# Patient Record
Sex: Female | Born: 1970 | Race: Black or African American | Hispanic: No | Marital: Married | Smoking: Never smoker
Health system: Southern US, Community
[De-identification: ages and names within clinical notes are randomized; demographics above are authoritative.]

## PROBLEM LIST (undated history)

## (undated) DIAGNOSIS — K219 Gastro-esophageal reflux disease without esophagitis: Secondary | ICD-10-CM

## (undated) DIAGNOSIS — I10 Essential (primary) hypertension: Secondary | ICD-10-CM

## (undated) DIAGNOSIS — I4891 Unspecified atrial fibrillation: Secondary | ICD-10-CM

## (undated) HISTORY — PX: DILATION AND CURETTAGE OF UTERUS: SHX78

## (undated) HISTORY — PX: KNEE SURGERY: SHX244

---

## 2002-11-11 ENCOUNTER — Encounter (INDEPENDENT_AMBULATORY_CARE_PROVIDER_SITE_OTHER): Payer: Self-pay

## 2002-11-11 ENCOUNTER — Ambulatory Visit (HOSPITAL_COMMUNITY): Admission: RE | Admit: 2002-11-11 | Discharge: 2002-11-11 | Payer: Self-pay | Admitting: Obstetrics & Gynecology

## 2004-06-30 ENCOUNTER — Inpatient Hospital Stay (HOSPITAL_COMMUNITY): Admission: AD | Admit: 2004-06-30 | Discharge: 2004-06-30 | Payer: Self-pay | Admitting: Obstetrics and Gynecology

## 2004-07-07 ENCOUNTER — Inpatient Hospital Stay (HOSPITAL_COMMUNITY): Admission: AD | Admit: 2004-07-07 | Discharge: 2004-07-10 | Payer: Self-pay | Admitting: Obstetrics and Gynecology

## 2004-07-18 ENCOUNTER — Ambulatory Visit (HOSPITAL_COMMUNITY): Admission: RE | Admit: 2004-07-18 | Discharge: 2004-07-18 | Payer: Self-pay | Admitting: Obstetrics & Gynecology

## 2004-07-29 ENCOUNTER — Ambulatory Visit: Payer: Self-pay | Admitting: *Deleted

## 2004-08-05 ENCOUNTER — Ambulatory Visit: Payer: Self-pay | Admitting: *Deleted

## 2004-08-06 ENCOUNTER — Observation Stay (HOSPITAL_COMMUNITY): Admission: AD | Admit: 2004-08-06 | Discharge: 2004-08-07 | Payer: Self-pay | Admitting: Obstetrics & Gynecology

## 2004-08-08 ENCOUNTER — Inpatient Hospital Stay (HOSPITAL_COMMUNITY): Admission: AD | Admit: 2004-08-08 | Discharge: 2004-08-10 | Payer: Self-pay | Admitting: Obstetrics and Gynecology

## 2004-09-17 ENCOUNTER — Other Ambulatory Visit: Admission: RE | Admit: 2004-09-17 | Discharge: 2004-09-17 | Payer: Self-pay | Admitting: Obstetrics & Gynecology

## 2008-04-19 ENCOUNTER — Emergency Department (HOSPITAL_BASED_OUTPATIENT_CLINIC_OR_DEPARTMENT_OTHER): Admission: EM | Admit: 2008-04-19 | Discharge: 2008-04-19 | Payer: Self-pay | Admitting: Emergency Medicine

## 2008-04-19 ENCOUNTER — Ambulatory Visit: Payer: Self-pay | Admitting: Diagnostic Radiology

## 2008-08-06 ENCOUNTER — Ambulatory Visit: Payer: Self-pay | Admitting: Diagnostic Radiology

## 2008-08-06 ENCOUNTER — Emergency Department (HOSPITAL_BASED_OUTPATIENT_CLINIC_OR_DEPARTMENT_OTHER): Admission: EM | Admit: 2008-08-06 | Discharge: 2008-08-06 | Payer: Self-pay | Admitting: Emergency Medicine

## 2010-06-02 LAB — BASIC METABOLIC PANEL
BUN: 13 mg/dL (ref 6–23)
CO2: 32 mEq/L (ref 19–32)
Calcium: 9.4 mg/dL (ref 8.4–10.5)
GFR calc Af Amer: >60 mL/min (ref >60)
GFR calc non Af Amer: >60 mL/min (ref >60)

## 2010-06-02 LAB — POCT CARDIAC MARKERS: Myoglobin, poc: 50 ng/mL (ref 12–200)

## 2010-06-02 LAB — DIFFERENTIAL
Basophils Absolute: 0.1 10*3/uL (ref 0.0–0.1)
Monocytes Absolute: 0.7 10*3/uL (ref 0.1–1.0)
Neutro Abs: 6.2 10*3/uL (ref 1.7–7.7)
Neutrophils Relative %: 62 % (ref 43–77)

## 2010-06-02 LAB — CBC
HCT: 38.6 % (ref 36.0–46.0)
MCV: 87.5 fL (ref 78.0–100.0)
Platelets: 258 10*3/uL (ref 150–400)
RDW: 12.1 % (ref 11.5–15.5)
WBC: 9.9 10*3/uL (ref 4.0–10.5)

## 2010-06-02 LAB — D-DIMER, QUANTITATIVE: D-Dimer, Quant: <0.22 ug/mL-FEU (ref 0.00–0.48)

## 2010-06-10 LAB — URINE MICROSCOPIC-ADD ON

## 2010-06-10 LAB — URINALYSIS, ROUTINE W REFLEX MICROSCOPIC
Glucose, UA: NEGATIVE mg/dL
Hgb urine dipstick: NEGATIVE
Specific Gravity, Urine: 1.021 (ref 1.005–1.030)
pH: 6.5 (ref 5.0–8.0)

## 2010-06-10 LAB — COMPREHENSIVE METABOLIC PANEL
ALT: 15 U/L (ref 0–35)
Albumin: 3.9 g/dL (ref 3.5–5.2)
Alkaline Phosphatase: 74 U/L (ref 39–117)
BUN: 8 mg/dL (ref 6–23)
GFR calc Af Amer: >60 mL/min (ref >60)
Potassium: 3.8 mEq/L (ref 3.5–5.1)
Sodium: 142 mEq/L (ref 135–145)
Total Protein: 7.7 g/dL (ref 6.0–8.3)

## 2010-06-10 LAB — DIFFERENTIAL
Basophils Relative: 1 % (ref 0–1)
Monocytes Absolute: 0.7 10*3/uL (ref 0.1–1.0)
Monocytes Relative: 7 % (ref 3–12)
Neutro Abs: 6.5 10*3/uL (ref 1.7–7.7)

## 2010-06-10 LAB — CBC
Platelets: 252 10*3/uL (ref 150–400)
RDW: 12.6 % (ref 11.5–15.5)

## 2010-07-11 NOTE — Discharge Summary (Signed)
NAME:  JAKEIA, CARRERAS                 ACCOUNT NO.:  [id number]   MEDICAL RECORD NO.:  [id number]          PATIENT TYPE:  INP   LOCATION:  9149                          FACILITY:  [facility]   PHYSICIAN:  Freddy Finner, M.D.   DATE OF BIRTH:  Aug 19, 1970   DATE OF ADMISSION:  07/07/2004  DATE OF DISCHARGE:  07/10/2004                                 DISCHARGE SUMMARY   ADMITTING DIAGNOSES:  1.  Intrauterine pregnancy at 32 and five-sevenths weeks.  2.  Nonreassuring fetal heart tones.  3.  Chronic hypertension.   DISCHARGE DIAGNOSES:  1.  Intrauterine pregnancy at 83 weeks estimated gestational age.  2.  Chronic hypertension.   REASON FOR ADMISSION:  Please see written H&P.   HOSPITAL COURSE:  The patient was a 40 year old African-American black  female gravida 4 para 2 that was admitted to [facility] at  32 and five-sevenths weeks estimated gestational age for observation. The  patient had been seen in the office and was noted to have some nonreassuring  fetal heart tones on the monitor. Pregnancy had been complicated by chronic  hypertension, currently on labetalol 100 mg b.i.d. The patient was sent to  triage area for additional monitoring and biophysical profile. Biophysical  profile was performed which revealed score 6/8 with AFI of 8.4 which was in  the 5th percentile. The patient had had a previous biophysical profile  approximately 1 week prior to admission with AFI of 11.1. The patient denied  leakage of fluid, CNS symptoms, or epigastric pain. Blood pressures on  admission 130s to 150s over 80s to 90s. Cervix was not examined; however,  was noted to be 4.1 cm in length per ultrasound. Deep tendon reflexes 2+  without clonus. Fetal heart tones were noted to have some accelerations with  several variable decelerations. PIH labs were within normal limits. The  patient was admitted, was administered IV fluids, betamethasone, and  continuous external fetal monitoring.  The patient was scheduled to have 24-  hour urine collection, PIH labs and biophysical profile. On the following  morning the patient was without complaint. Vital signs were stable with  blood pressure 130/80. Liver function tests remained within normal limits.  Hemoglobin is 11.1; platelet count 286,000. Urine collection was negative  for protein. On hospital day #2 the patient was without complaint. Fetal  movement was noted to be good. Vital signs were stable. Fetal monitoring did  note that fetal heart tones were reactive. No contractions were noted.  Creatinine clearance was 161. The patient was scheduled for biophysical  profile which performed later in the day which revealed 6.8. On the  following morning, vital signs were stable. Blood pressure 120/80. Repeat  ultrasound had revealed AFI was now up to 11.1. Biophysical profile was 8/8.  MCA was normal, fetal heart tones were reactive. Decision was made to  discharge the patient home on bedrest and continue labetalol.   CONDITION ON DISCHARGE:  Stable.   DIET:  Regular as tolerated.   ACTIVITY:  Moderate bedrest.   FOLLOW UP:  The patient to follow up in  the office in 5 days. She is to call  for decreased fetal movement, spontaneous rupture of membranes or increase  in labor contractions, headache, blurred vision, or right upper quadrant  pain.   DISCHARGE MEDICATIONS:  1.  Labetalol 100 mg one p.o. b.i.d.  2.  Prenatal vitamins one p.o. daily.       CC/MEDQ  D:  08/01/2004  T:  08/01/2004  Job:  [id number]

## 2010-07-11 NOTE — Op Note (Signed)
NAME:  Judith Aguirre, Judith Aguirre                           ACCOUNT NO.:  [id number]   MEDICAL RECORD NO.:  [id number]                   PATIENT TYPE:  AMB   LOCATION:  SDC                                  FACILITY:  [facility]   PHYSICIAN:  Freddy Finner, M.D.                DATE OF BIRTH:  June 20, 1970   DATE OF PROCEDURE:  11/11/2002  DATE OF DISCHARGE:                                 OPERATIVE REPORT   PREOPERATIVE DIAGNOSIS:  Intrauterine fetal demise at 19-4/7 weeks'  gestation by dates and early ultrasound.   POSTOPERATIVE DIAGNOSIS:  Intrauterine fetal demise at 19-4/7 weeks'  gestation by dates and early ultrasound.   OPERATIVE PROCEDURE:  Dilatation and evacuation.   SURGEON:  Freddy Finner, M.D.   ANESTHESIA:  General.   ESTIMATED INTRAOPERATIVE BLOOD LOSS:  400 mL.   INTRAOPERATIVE COMPLICATIONS:  None.   The patient's blood type is known to be B positive.   The patient is a 40 year old black married female, gravida 3, para 2, who  was seen for her initial obstetrical exam on August 20, at which time she  was 15-3/7 weeks' gestation and ultrasound confirmed fetal heart tones and a  viable pregnancy.  She presented for routine examination on the day prior to  this admission, at which time ultrasound was obtained which showed no fetal  heart tones and showed an 18 week size fetus.  After careful consultation  with the patient and her husband regarding the options of treatment, which  included the D&E which we are now here to perform, a more prolonged  induction of labor, or temporizing measures awaiting the onset of labor, the  patient has selected this method.  The risk of infection, bleeding, and  uterine perforation were discussed with both her and her husband.  On the  day prior to admission a medium thick laminaria was inserted into the cervix  and a pack placed to keep it in place overnight.   After admission on the morning of surgery, she was given a bolus of  ampicillin  IV.  She was brought to the operating room, there placed under  adequate general anesthesia.  She was placed in the dorsal lithotomy  position using the [employer] system.  Betadine prep was carried out in  the usual fashion.  Bivalve speculum was initially placed, and the cervix  was visualized and grasped with a ring forceps.  This was later changed to a  Jacobs tenaculum because the ring forceps would not adequately hold the  cervix.  The cervix was progressively dilated with Hegar dilators to allow  passage of a 16 mm suction cannula.  This was done and aspiration produced  amniotic fluid and obvious products of conception.  Using large placental  forceps, all fetal parts were removed and accounted for.  This also included  the placenta.  Gentle thorough curettage was carried out  with a large sharp  curette followed by repeat vacuum aspiration.  During the course of the  procedure the patient was given intravenous Pitocin along with IV fluids.  She was given 0.2 mg of subcu Methergine.  On completion of the procedure  hemostasis was adequate.  The patient was awakened and taken to the recovery  room in good condition.  Postoperatively she will be maintained on  ampicillin for her complaint of an abscessed tooth.  She has a dental  appointment in approximately four days.  She  was also given Vicodin to be taken as needed for postoperative pain and for  tooth pain.  She will be given routine outpatient surgical instructions.  She is to come to the office in approximately two weeks for postoperative  follow-up.                                                Freddy Finner, M.D.    WRN/MEDQ  D:  11/11/2002  T:  11/11/2002  Job:  [id number]

## 2012-10-15 ENCOUNTER — Encounter (HOSPITAL_BASED_OUTPATIENT_CLINIC_OR_DEPARTMENT_OTHER): Payer: Self-pay

## 2012-10-15 ENCOUNTER — Emergency Department (HOSPITAL_BASED_OUTPATIENT_CLINIC_OR_DEPARTMENT_OTHER)
Admission: EM | Admit: 2012-10-15 | Discharge: 2012-10-15 | Disposition: A | Discharge status: Home / Self Care | Payer: No Typology Code available for payment source | Attending: Emergency Medicine | Admitting: Emergency Medicine

## 2012-10-15 DIAGNOSIS — Y9389 Activity, other specified: Secondary | ICD-10-CM | POA: Insufficient documentation

## 2012-10-15 DIAGNOSIS — Y92410 Unspecified street and highway as the place of occurrence of the external cause: Secondary | ICD-10-CM | POA: Insufficient documentation

## 2012-10-15 DIAGNOSIS — Z79899 Other long term (current) drug therapy: Secondary | ICD-10-CM | POA: Insufficient documentation

## 2012-10-15 DIAGNOSIS — S0990XA Unspecified injury of head, initial encounter: Secondary | ICD-10-CM | POA: Insufficient documentation

## 2012-10-15 DIAGNOSIS — IMO0002 Reserved for concepts with insufficient information to code with codable children: Secondary | ICD-10-CM | POA: Insufficient documentation

## 2012-10-15 DIAGNOSIS — I10 Essential (primary) hypertension: Secondary | ICD-10-CM | POA: Insufficient documentation

## 2012-10-15 HISTORY — DX: Essential (primary) hypertension: I10
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
## 2017-09-09 ENCOUNTER — Emergency Department (HOSPITAL_BASED_OUTPATIENT_CLINIC_OR_DEPARTMENT_OTHER)
Admission: EM | Admit: 2017-09-09 | Discharge: 2017-09-09 | Disposition: A | Discharge status: Home / Self Care | Payer: Managed Care, Other (non HMO) | Attending: Emergency Medicine | Admitting: Emergency Medicine

## 2017-09-09 ENCOUNTER — Emergency Department (HOSPITAL_BASED_OUTPATIENT_CLINIC_OR_DEPARTMENT_OTHER): Payer: Managed Care, Other (non HMO)

## 2017-09-09 ENCOUNTER — Encounter (HOSPITAL_BASED_OUTPATIENT_CLINIC_OR_DEPARTMENT_OTHER): Payer: Self-pay | Admitting: Emergency Medicine

## 2017-09-09 ENCOUNTER — Other Ambulatory Visit: Payer: Self-pay

## 2017-09-09 DIAGNOSIS — Y93F1 Activity, caregiving, bathing: Secondary | ICD-10-CM | POA: Diagnosis not present

## 2017-09-09 DIAGNOSIS — Y92002 Bathroom of unspecified non-institutional (private) residence single-family (private) house as the place of occurrence of the external cause: Secondary | ICD-10-CM | POA: Diagnosis not present

## 2017-09-09 DIAGNOSIS — S43401A Unspecified sprain of right shoulder joint, initial encounter: Secondary | ICD-10-CM | POA: Insufficient documentation

## 2017-09-09 DIAGNOSIS — I10 Essential (primary) hypertension: Secondary | ICD-10-CM | POA: Diagnosis not present

## 2017-09-09 DIAGNOSIS — Y998 Other external cause status: Secondary | ICD-10-CM | POA: Insufficient documentation

## 2017-09-09 DIAGNOSIS — W010XXA Fall on same level from slipping, tripping and stumbling without subsequent striking against object, initial encounter: Secondary | ICD-10-CM | POA: Insufficient documentation

## 2017-09-09 DIAGNOSIS — S4991XA Unspecified injury of right shoulder and upper arm, initial encounter: Secondary | ICD-10-CM | POA: Diagnosis present

## 2017-09-09 DIAGNOSIS — Z79899 Other long term (current) drug therapy: Secondary | ICD-10-CM | POA: Diagnosis not present

## 2017-09-09 MED ORDER — NAPROXEN 500 MG PO TABS: 500.0000 mg | ORAL_TABLET | Freq: Two times a day (BID) | ORAL | 0 refills | Status: DC

## 2017-09-09 MED ORDER — HYDROCODONE-ACETAMINOPHEN 5-325 MG PO TABS: 1.0000 | ORAL_TABLET | Freq: Four times a day (QID) | ORAL | 0 refills | Status: DC | PRN

## 2017-09-09 NOTE — Discharge Instructions (Signed)
Naproxen as prescribed.  Hydrocodone is prescribed as needed for pain.  Wear arm sling for the next several days, then slowly reintroduce activity.  Follow-up with orthopedics if not improving in the next 1 to 2 weeks.

## 2017-09-09 NOTE — ED Triage Notes (Signed)
R shoulder pain since slipping in the shower this morning. Took 800mg  ibuprofen at 14:00

## 2017-09-09 NOTE — ED Provider Notes (Addendum)
[facility] EMERGENCY DEPARTMENT Provider Note   CSN: [id number] Arrival date & time: 09/09/17  1818     History   Chief Complaint Chief Complaint  Patient presents with  . Shoulder Injury    HPI Judith Aguirre is a 47 y.o. female.  Patient is a 47 year old female with past medical history of hypertension and morbid obesity.  She presents for evaluation of right shoulder pain.  She reports slipping in the shower this morning and grabbing the rail to keep herself from falling.  Since then, she has been experiencing pain in the lateral aspect of her right shoulder.  There is no numbness or tingling.  There is no weakness.  She did not actually fall, but used her arm to prevent herself from falling.  The history is provided by the patient.  Shoulder Injury  This is a new problem. Episode onset: This morning. The problem occurs constantly. The problem has been gradually worsening. Exacerbated by: Movement and palpation. Nothing relieves the symptoms. She has tried nothing for the symptoms.    Past Medical History:  Diagnosis Date  . Hypertension     There are no active problems to display for this patient.   History reviewed. No pertinent surgical history.   OB History   None      Home Medications    Prior to Admission medications   Medication Sig Start Date End Date Taking? Authorizing Provider  amLODipine (NORVASC) 10 MG tablet Take 10 mg by mouth daily.    [provider]  hydrochlorothiazide (HYDRODIURIL) 12.5 MG tablet Take 12.5 mg by mouth daily.    [provider]  losartan (COZAAR) 50 MG tablet Take 50 mg by mouth 2 (two) times daily.    [provider]    Family History No family history on file.  Social History Social History   Tobacco Use  . Smoking status: Never Smoker  . Smokeless tobacco: Never Used  Substance Use Topics  . Alcohol use: Yes  . Drug use: Never     Allergies   Patient has no known  allergies.   Review of Systems Review of Systems  All other systems reviewed and are negative.    Physical Exam Updated Vital Signs BP (!) 165/93 (BP Location: Left Arm)   Pulse 78   Temp 98.2 F (36.8 C) (Oral)   Resp 20   Ht 6' (1.829 m)   Wt (!) 189.6 kg (418 lb)   LMP 09/09/2017   SpO2 100%   BMI 56.69 kg/m   Physical Exam  Constitutional: She is oriented to person, place, and time. She appears well-developed and well-nourished. No distress.  HENT:  Head: Normocephalic and atraumatic.  Neck: Normal range of motion. Neck supple.  Pulmonary/Chest: Effort normal.  Musculoskeletal:  The right shoulder appears grossly normal.  There is no obvious deformity.  Ulnar and radial pulses are easily palpable.  She is able to flex, extend, and oppose all fingers and sensation is intact throughout the entire hand.  There is tenderness over the lateral aspect of the shoulder.  She has pain with abduction and external rotation.  Neurological: She is alert and oriented to person, place, and time.  Skin: Skin is warm and dry. She is not diaphoretic.  Nursing note and vitals reviewed.    ED Treatments / Results  Labs (all labs ordered are listed, but only abnormal results are displayed) Labs Reviewed - No data to display  EKG None  Radiology Dg  Shoulder Right  Result Date: 09/09/2017 CLINICAL DATA:  Right shoulder pain since slipping in the shower this morning. EXAM: RIGHT SHOULDER - 2+ VIEW COMPARISON:  CXR exam from 08/06/2008 FINDINGS: The AC and glenohumeral joints are maintained about the right shoulder. New corticated ossific density is seen off the lateral aspect of the acromion since the prior comparison chest radiograph 2010, uncertain as to whether this represents a lateral acromial spur or stigmata of old remote injury. It appears to demonstrate well corticated margins and therefore is believed less likely to represent an acute fracture. This can be further correlated  with CT of the shoulder. No intra-articular loose body is seen. The adjacent ribs and lung are nonacute. IMPRESSION: Corticated ossific density off the lateral margin of the acromion may represent a lateral acromial spur. This may predispose to rotator cuff tears. Otherwise, this may represent stigmata of age-indeterminate trauma or remote fracture. CT may help for further correlation if clinically necessary. Electronically Signed   By: Tollie Eth M.D.   On: 09/09/2017 19:02    Procedures Procedures (including critical care time)  Medications Ordered in ED Medications - No data to display   Initial Impression / Assessment and Plan / ED Course  I have reviewed the triage vital signs and the nursing notes.  Pertinent labs & imaging results that were available during my care of the patient were reviewed by me and considered in my medical decision making (see chart for details).  X-ray shows spurring of the acromion process which has an increased incidence of rotator cuff injury.  She is able to move the shoulder however with discomfort.  She will be placed in an arm sling, treated with anti-inflammatories, pain medicine, and follow-up with her orthopedist if she is not improving in the next 1 to 2 weeks.  Final Clinical Impressions(s) / ED Diagnoses   Final diagnoses:  None    ED Discharge Orders    None       Geoffery Lyons, MD 09/09/17 1919    Geoffery Lyons, MD 09/10/17 2357

## 2018-01-08 ENCOUNTER — Emergency Department (HOSPITAL_BASED_OUTPATIENT_CLINIC_OR_DEPARTMENT_OTHER)
Admission: EM | Admit: 2018-01-08 | Discharge: 2018-01-08 | Disposition: A | Discharge status: Home / Self Care | Payer: Managed Care, Other (non HMO) | Attending: Emergency Medicine | Admitting: Emergency Medicine

## 2018-01-08 ENCOUNTER — Emergency Department (HOSPITAL_BASED_OUTPATIENT_CLINIC_OR_DEPARTMENT_OTHER): Payer: Managed Care, Other (non HMO)

## 2018-01-08 ENCOUNTER — Other Ambulatory Visit: Payer: Self-pay

## 2018-01-08 ENCOUNTER — Encounter (HOSPITAL_BASED_OUTPATIENT_CLINIC_OR_DEPARTMENT_OTHER): Payer: Self-pay | Admitting: Emergency Medicine

## 2018-01-08 DIAGNOSIS — I10 Essential (primary) hypertension: Secondary | ICD-10-CM | POA: Insufficient documentation

## 2018-01-08 DIAGNOSIS — Z79899 Other long term (current) drug therapy: Secondary | ICD-10-CM | POA: Insufficient documentation

## 2018-01-08 DIAGNOSIS — M25561 Pain in right knee: Secondary | ICD-10-CM | POA: Insufficient documentation

## 2018-01-08 NOTE — ED Triage Notes (Signed)
Patient states that she fell and hurt her right knee yesterday  - states that she feels worse today

## 2018-01-08 NOTE — ED Provider Notes (Signed)
[facility] EMERGENCY DEPARTMENT Provider Note   CSN: [id number] Arrival date & time: 01/08/18  1619     History   Chief Complaint Chief Complaint  Patient presents with  . Knee Pain    HPI Judith Aguirre is a 47 y.o. female.  The history is provided by the patient.  Knee Pain   This is a new problem. The current episode started yesterday. The problem occurs constantly. The problem has not changed since onset.The pain is present in the right knee. The quality of the pain is described as aching. The pain is at a severity of 2/10. The pain is mild. Pertinent negatives include no numbness, full range of motion, no stiffness and no tingling. The symptoms are aggravated by contact. She has tried OTC pain medications for the symptoms. The treatment provided mild relief.    Past Medical History:  Diagnosis Date  . Hypertension     There are no active problems to display for this patient.   Past Surgical History:  Procedure Laterality Date  . DILATION AND CURETTAGE OF UTERUS    . KNEE SURGERY       OB History   None      Home Medications    Prior to Admission medications   Medication Sig Start Date End Date Taking? Authorizing Provider  amLODipine (NORVASC) 10 MG tablet Take 10 mg by mouth daily.    [provider]  hydrochlorothiazide (HYDRODIURIL) 12.5 MG tablet Take 12.5 mg by mouth daily.    [provider]  HYDROcodone-acetaminophen (NORCO) 5-325 MG tablet Take 1-2 tablets by mouth every 6 (six) hours as needed. 09/09/17   Geoffery Lyonselo, Douglas, MD  losartan (COZAAR) 50 MG tablet Take 50 mg by mouth 2 (two) times daily.    [provider]  naproxen (NAPROSYN) 500 MG tablet Take 1 tablet (500 mg total) by mouth 2 (two) times daily. 09/09/17   Geoffery Lyonselo, Douglas, MD    Family History History reviewed. No pertinent family history.  Social History Social History   Tobacco Use  . Smoking status: Never Smoker  . Smokeless tobacco: Never Used   Substance Use Topics  . Alcohol use: Yes  . Drug use: Never     Allergies   Patient has no known allergies.   Review of Systems Review of Systems  Constitutional: Negative for chills and fever.  HENT: Negative for ear pain and sore throat.   Eyes: Negative for pain and visual disturbance.  Respiratory: Negative for cough and shortness of breath.   Cardiovascular: Negative for chest pain and palpitations.  Gastrointestinal: Negative for abdominal pain and vomiting.  Genitourinary: Negative for dysuria and hematuria.  Musculoskeletal: Positive for arthralgias, gait problem and joint swelling. Negative for back pain and stiffness.  Skin: Negative for color change and rash.  Neurological: Negative for tingling, seizures, syncope and numbness.  All other systems reviewed and are negative.    Physical Exam Updated Vital Signs  ED Triage Vitals  Enc Vitals Group     BP 01/08/18 1627 (!) 157/84     Pulse Rate 01/08/18 1627 (!) 110     Resp 01/08/18 1627 20     Temp 01/08/18 1627 98.1 F (36.7 C)     Temp Source 01/08/18 1627 Oral     SpO2 01/08/18 1627 97 %     Weight 01/08/18 1625 (!) 419 lb (190.1 kg)     Height 01/08/18 1625 5\' 11"  (1.803 m)     Head Circumference --  Peak Flow --      Pain Score 01/08/18 1625 8     Pain Loc --      Pain Edu? --      Excl. in GC? --     Physical Exam  Constitutional: She appears well-developed and well-nourished.  Musculoskeletal: Normal range of motion. She exhibits edema (trace right knee edema) and tenderness (around left knee). She exhibits no deformity.  No obvious laxity of right knee joint   Neurological: She is alert.  5+/5 strength in RLE and normal sensation in RLE.  Skin: Skin is warm. Capillary refill takes less than 2 seconds. No rash noted.     ED Treatments / Results  Labs (all labs ordered are listed, but only abnormal results are displayed) Labs Reviewed - No data to display  EKG None  Radiology Dg  Knee Complete 4 Views Right  Result Date: 01/08/2018 CLINICAL DATA:  Fall, right knee pain and swelling EXAM: RIGHT KNEE - COMPLETE 4+ VIEW COMPARISON:  None. FINDINGS: Tricompartment degenerative changes with joint space narrowing and spurring, most pronounced in the patellofemoral compartment. Small joint effusion. No acute bony abnormality. Specifically, no fracture, subluxation, or dislocation. 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 No acute bony abnormality. Electronically Signed   By: Charlett Nose M.D.   On: 01/08/2018 17:03    Procedures Procedures (including critical care time)  Medications Ordered in ED Medications - No data to display   Initial Impression / Assessment and Plan / ED Course  I have reviewed the triage vital signs and the nursing notes.  Pertinent labs & imaging results that were available during my care of the patient were reviewed by me and considered in my medical decision making (see chart for details).     Judith Aguirre is a 47 year old female with no significant medical history who presents to the ED with right knee pain after a fall.  Patient with unremarkable vitals.  No fever.  Patient landed on her right knee yesterday.  Has been able to ambulate since.  Has had some pain in the right knee since.  Has had Motrin with improvement.  Patient had a meniscal injury in the left knee in the past.  X-ray showed no acute fracture, malalignment.  Patient has small joint effusion likely from trauma.  Has some tenderness around the right knee but is neurovascularly and neuromuscularly intact.  Recommend continued use of Motrin, ice, compression.  Recommend follow-up with primary care doctor as needed.  Told to return to ED if symptoms worsen as well.  This chart was dictated using voice recognition software.  Despite best efforts to proofread,  errors can occur which can change the documentation meaning.   Final  Clinical Impressions(s) / ED Diagnoses   Final diagnoses:  Acute pain of right knee    ED Discharge Orders    None       Virgina Norfolk, DO 01/08/18 1727

## 2019-01-29 ENCOUNTER — Inpatient Hospital Stay (HOSPITAL_COMMUNITY)
Admission: EM | Admit: 2019-01-29 | Discharge: 2019-02-03 | DRG: 177 | Disposition: A | Discharge status: Home / Self Care | Payer: PRIVATE HEALTH INSURANCE | Source: Ambulatory Visit | Attending: Internal Medicine | Admitting: Internal Medicine

## 2019-01-29 ENCOUNTER — Emergency Department (HOSPITAL_COMMUNITY): Payer: PRIVATE HEALTH INSURANCE

## 2019-01-29 ENCOUNTER — Encounter (HOSPITAL_COMMUNITY): Payer: Self-pay

## 2019-01-29 ENCOUNTER — Other Ambulatory Visit: Payer: Self-pay

## 2019-01-29 DIAGNOSIS — Z6841 Body Mass Index (BMI) 40.0 and over, adult: Secondary | ICD-10-CM

## 2019-01-29 DIAGNOSIS — R059 Cough, unspecified: Secondary | ICD-10-CM

## 2019-01-29 DIAGNOSIS — U071 COVID-19: Principal | ICD-10-CM | POA: Diagnosis present

## 2019-01-29 DIAGNOSIS — I10 Essential (primary) hypertension: Secondary | ICD-10-CM | POA: Diagnosis present

## 2019-01-29 DIAGNOSIS — R0602 Shortness of breath: Secondary | ICD-10-CM | POA: Diagnosis not present

## 2019-01-29 DIAGNOSIS — Z791 Long term (current) use of non-steroidal anti-inflammatories (NSAID): Secondary | ICD-10-CM

## 2019-01-29 DIAGNOSIS — Z888 Allergy status to other drugs, medicaments and biological substances status: Secondary | ICD-10-CM

## 2019-01-29 DIAGNOSIS — Z79899 Other long term (current) drug therapy: Secondary | ICD-10-CM

## 2019-01-29 DIAGNOSIS — J9601 Acute respiratory failure with hypoxia: Secondary | ICD-10-CM | POA: Diagnosis present

## 2019-01-29 DIAGNOSIS — Z8249 Family history of ischemic heart disease and other diseases of the circulatory system: Secondary | ICD-10-CM

## 2019-01-29 DIAGNOSIS — Z87891 Personal history of nicotine dependence: Secondary | ICD-10-CM

## 2019-01-29 DIAGNOSIS — J1282 Pneumonia due to coronavirus disease 2019: Secondary | ICD-10-CM

## 2019-01-29 DIAGNOSIS — K219 Gastro-esophageal reflux disease without esophagitis: Secondary | ICD-10-CM | POA: Diagnosis present

## 2019-01-29 DIAGNOSIS — Z9114 Patient's other noncompliance with medication regimen: Secondary | ICD-10-CM

## 2019-01-29 DIAGNOSIS — J189 Pneumonia, unspecified organism: Secondary | ICD-10-CM | POA: Diagnosis present

## 2019-01-29 DIAGNOSIS — R05 Cough: Principal | ICD-10-CM

## 2019-01-29 DIAGNOSIS — J1289 Other viral pneumonia: Secondary | ICD-10-CM | POA: Diagnosis present

## 2019-01-29 HISTORY — DX: Morbid (severe) obesity due to excess calories: E66.01

## 2019-01-29 HISTORY — DX: Gastro-esophageal reflux disease without esophagitis: K21.9

## 2019-01-29 LAB — BASIC METABOLIC PANEL
Anion gap: 12 (ref 5–15)
BUN: 7 mg/dL (ref 6–20)
CO2: 25 mmol/L (ref 22–32)
Calcium: 9 mg/dL (ref 8.9–10.3)
Chloride: 100 mmol/L (ref 98–111)
Creatinine, Ser: 0.77 mg/dL (ref 0.44–1.00)
GFR calc Af Amer: >60 mL/min (ref >60)
GFR calc non Af Amer: >60 mL/min (ref >60)
Glucose, Bld: 136 mg/dL — ABNORMAL HIGH (ref 70–99)
Potassium: 4 mmol/L (ref 3.5–5.1)
Sodium: 137 mmol/L (ref 135–145)

## 2019-01-29 LAB — POC SARS CORONAVIRUS 2 AG -  ED: SARS Coronavirus 2 Ag: NEGATIVE

## 2019-01-29 LAB — C-REACTIVE PROTEIN: CRP: 19.2 mg/dL — ABNORMAL HIGH (ref <1.0)

## 2019-01-29 LAB — CBC WITH DIFFERENTIAL/PLATELET
Abs Immature Granulocytes: 0.03 10*3/uL (ref 0.00–0.07)
Basophils Absolute: 0 10*3/uL (ref 0.0–0.1)
Basophils Relative: 0 %
Eosinophils Absolute: 0 10*3/uL (ref 0.0–0.5)
Eosinophils Relative: 0 %
HCT: 42.7 % (ref 36.0–46.0)
Hemoglobin: 14.3 g/dL (ref 12.0–15.0)
Immature Granulocytes: 0 %
Lymphocytes Relative: 12 %
Lymphs Abs: 0.8 10*3/uL (ref 0.7–4.0)
MCH: 30.1 pg (ref 26.0–34.0)
MCHC: 33.5 g/dL (ref 30.0–36.0)
MCV: 89.9 fL (ref 80.0–100.0)
Monocytes Absolute: 0.4 10*3/uL (ref 0.1–1.0)
Monocytes Relative: 6 %
Neutro Abs: 5.6 10*3/uL (ref 1.7–7.7)
Neutrophils Relative %: 82 %
Platelets: 211 10*3/uL (ref 150–400)
RBC: 4.75 MIL/uL (ref 3.87–5.11)
RDW: 13.2 % (ref 11.5–15.5)
WBC: 6.8 10*3/uL (ref 4.0–10.5)
nRBC: 0 % (ref 0.0–0.2)

## 2019-01-29 LAB — D-DIMER, QUANTITATIVE: D-Dimer, Quant: 1.01 ug/mL-FEU — ABNORMAL HIGH (ref 0.00–0.50)

## 2019-01-29 LAB — FERRITIN: Ferritin: 204 ng/mL (ref 11–307)

## 2019-01-29 LAB — I-STAT BETA HCG BLOOD, ED (MC, WL, AP ONLY): I-stat hCG, quantitative: <5 m[IU]/mL (ref <5)

## 2019-01-29 LAB — BRAIN NATRIURETIC PEPTIDE: B Natriuretic Peptide: 38.8 pg/mL (ref 0.0–100.0)

## 2019-01-29 MED ORDER — IOHEXOL 350 MG/ML SOLN
100.0000 mL | Freq: Once | INTRAVENOUS | Status: AC | PRN
  Administered 2019-01-29: 100 mL via INTRAVENOUS

## 2019-01-29 MED ORDER — SODIUM CHLORIDE (PF) 0.9 % IJ SOLN
INTRAMUSCULAR | Status: AC
  Filled 2019-01-29: qty 50

## 2019-01-29 NOTE — ED Provider Notes (Signed)
[facility] DEPT Provider Note   CSN: [id number] Arrival date & time: 01/29/19  1640     History   Chief Complaint Chief Complaint  Patient presents with  . Cough    HPI Judith Aguirre is a 48 y.o. female past medical 3 of hypertension who presents for evaluation of cough and shortness of breath that has been ongoing for the last 2 weeks.  Patient reports that she initially started having symptoms about 2 weeks ago.  She states that she started off with a low-grade fever, cough, shortness of breath.  States that the fever only lasted 2 days.  She got Covid tested initially and it was negative.  He states that since then, she has not had any more fever but states she is continued to have shortness of breath and cough.  States cough is productive of phlegm.  No hemoptysis.  Patient states that with shortness of breath has progressively worsened over the last 2 weeks.  She feels like she cannot get up and walk anywhere without becoming very short of breath.  She denies any chest pain or pleuritic chest pain.  She states that she has not noted any swelling in her legs.  She states she is not any body aches or generalized malaise.  She went to urgent care today for evaluation of symptoms.  He did a chest x-ray that was concerning for bilateral pneumonia and told her that she needed to go to the emergency department for further evaluation.  She has no history of asthma or COPD.  She does not smoke. She denies any OCP use, recent immobilization, prior history of DVT/PE, recent surgery, leg swelling, or long travel.     The history is provided by the patient.    Past Medical History:  Diagnosis Date  . Hypertension     There are no active problems to display for this patient.   Past Surgical History:  Procedure Laterality Date  . DILATION AND CURETTAGE OF UTERUS    . KNEE SURGERY       OB History   No obstetric history on file.      Home Medications     Prior to Admission medications   Medication Sig Start Date End Date Taking? Authorizing Provider  amLODipine (NORVASC) 10 MG tablet Take 10 mg by mouth daily.    [provider]  hydrochlorothiazide (HYDRODIURIL) 12.5 MG tablet Take 12.5 mg by mouth daily.    [provider]  HYDROcodone-acetaminophen (NORCO) 5-325 MG tablet Take 1-2 tablets by mouth every 6 (six) hours as needed. 09/09/17   Veryl Speak, MD  losartan (COZAAR) 50 MG tablet Take 50 mg by mouth 2 (two) times daily.    [provider]  naproxen (NAPROSYN) 500 MG tablet Take 1 tablet (500 mg total) by mouth 2 (two) times daily. 09/09/17   Veryl Speak, MD    Family History History reviewed. No pertinent family history.  Social History Social History   Tobacco Use  . Smoking status: Never Smoker  . Smokeless tobacco: Never Used  Substance Use Topics  . Alcohol use: Yes  . Drug use: Never     Allergies   Patient has no known allergies.   Review of Systems Review of Systems  Constitutional: Negative for fever.  Respiratory: Positive for cough and shortness of breath.   Cardiovascular: Negative for chest pain.  Gastrointestinal: Negative for abdominal pain, nausea and vomiting.  Genitourinary: Negative for dysuria and hematuria.  Neurological: Negative for headaches.  All other systems reviewed and are negative.    Physical Exam Updated Vital Signs BP (!) 167/88 (BP Location: Right Wrist)   Pulse 88   Temp 99 F (37.2 C) (Oral)   Resp 18   LMP 01/03/2019 (Approximate)   SpO2 92%   Physical Exam Vitals signs and nursing note reviewed.  Constitutional:      Appearance: Normal appearance. She is well-developed.  HENT:     Head: Normocephalic and atraumatic.  Eyes:     General: Lids are normal.     Conjunctiva/sclera: Conjunctivae normal.     Pupils: Pupils are equal, round, and reactive to light.  Neck:     Musculoskeletal: Full passive range of motion without pain.   Cardiovascular:     Rate and Rhythm: Normal rate and regular rhythm.     Pulses: Normal pulses.     Heart sounds: Normal heart sounds. No murmur. No friction rub. No gallop.   Pulmonary:     Effort: Pulmonary effort is normal.     Breath sounds: Normal breath sounds.     Comments: Appearance slightly winded. Speaking in medium to long sentences. No obvious wheezing but exam limited to body habitus  Abdominal:     Palpations: Abdomen is soft. Abdomen is not rigid.     Tenderness: There is no abdominal tenderness. There is no guarding.     Comments: Abdomen is soft, non-distended, non-tender. No rigidity, No guarding. No peritoneal signs.  Musculoskeletal: Normal range of motion.     Comments: BLE are symmetric in appearance without any overlying warmth, erythema or edema.   Skin:    General: Skin is warm and dry.     Capillary Refill: Capillary refill takes less than 2 seconds.  Neurological:     Mental Status: She is alert and oriented to person, place, and time.  Psychiatric:        Speech: Speech normal.      ED Treatments / Results  Labs (all labs ordered are listed, but only abnormal results are displayed) Labs Reviewed  BASIC METABOLIC PANEL - Abnormal; Notable for the following components:      Result Value   Glucose, Bld 136 (*)    All other components within normal limits  D-DIMER, QUANTITATIVE (NOT AT [facility]) - Abnormal; Notable for the following components:   D-Dimer, Quant 1.01 (*)    All other components within normal limits  SARS CORONAVIRUS 2 (TAT 6-24 HRS)  CBC WITH DIFFERENTIAL/PLATELET  BRAIN NATRIURETIC PEPTIDE  PROCALCITONIN  LACTATE DEHYDROGENASE  FERRITIN  TRIGLYCERIDES  C-REACTIVE PROTEIN  POC SARS CORONAVIRUS 2 AG -  ED  I-STAT BETA HCG BLOOD, ED ([facility], [facility], [facility] ONLY)    EKG None  Radiology Dg Chest Portable 1 View  Result Date: 01/29/2019 CLINICAL DATA:  Cough. EXAM: PORTABLE CHEST 1 VIEW COMPARISON:  January 29, 2019 FINDINGS: The lung volumes  are low. The heart size is enlarged. There are worsening hazy bilateral airspace opacities. There is no pneumothorax. No large pleural effusion. IMPRESSION: Worsening bilateral airspace opacities concerning for multifocal pneumonia (viral or bacterial). Electronically Signed   By: Katherine Mantlehristopher  Green M.D.   On: 01/29/2019 20:08    Procedures Procedures (including critical care time)  Medications Ordered in ED Medications - No data to display   Initial Impression / Assessment and Plan / ED Course  I have reviewed the triage vital signs and the nursing notes.  Pertinent labs & imaging results that were available  during my care of the patient were reviewed by me and considered in my medical decision making (see chart for details).        48 year old female who presents for evaluation of cough and shortness of breath x2 weeks.  Initially onset of the symptoms, she had some fevers but states fever is resolved.  Had 2 - Covid test.  Went to urgent care today for evaluation of symptoms of chest x-ray was concerning for pneumonia.  She was sent to the emergency department for further evaluation.  Initially arrival, she is afebrile.  She is slightly hypertensive.  O2 sats are 92% on room air.  On exam, she does appear slightly winded and is speaking in medium to long sentences.  She does not have any obvious wheezing but exam limited body habitus.  Concern for infectious etiology.  I reviewed urgent care's note.  O2 sats dropped down to 87% on room air at urgent care.  Combined with x-ray, they sent her to the emergency department for further evaluation.  Unfortunately, I cannot access the x-ray but I did show peribronchial thickening and increased interstitial markings.  I-stat beta negative. CBC without any leukocytosis or anemia. Hgb stable. BMP shows normal BUN/Cr. Dimer is elevated. CXR shows findings concerning for multifocal vs atypical pneumonia.   Her initial rapid COVID test was negative.  Will obtain send out COVID. Given her elevated Dimer, will evealuation with CTA.  Discussed with patient. While i'm talking to her, she is still labored and appearers to have increased work of breathing. While talking to toher, her O2 sats drops between 86%-8%. Plan to sign out to [place] Robinson, PA-C with CTA pending. Anticipate admission given hypoxia.     LALONI ROWTON was evaluated in Emergency Department on 01/29/2019 for the symptoms described in the history of present illness. She was evaluated in the context of the global COVID-19 pandemic, which necessitated consideration that the patient might be at risk for infection with the SARS-CoV-2 virus that causes COVID-19. Institutional protocols and algorithms that pertain to the evaluation of patients at risk for COVID-19 are in a state of rapid change based on information released by regulatory bodies including the CDC and federal and state organizations. These policies and algorithms were followed during the patient's care in the ED.   Portions of this note were generated with Scientist, clinical (histocompatibility and immunogenetics). Dictation errors may occur despite best attempts at proofreading.   Final Clinical Impressions(s) / ED Diagnoses duration of symptoms and chest x-ray   Final diagnoses:  Cough  Shortness of breath    ED Discharge Orders    None       Rosana Hoes 01/29/19 2230    Cathren Laine, MD 01/30/19 1321

## 2019-01-29 NOTE — ED Notes (Signed)
Pt transported to bedside. 

## 2019-01-29 NOTE — ED Notes (Signed)
PT O2 sats remained above 93% while ambulating around room. Pt did exhibit excessive coughing and fatigue with excertion

## 2019-01-29 NOTE — ED Triage Notes (Signed)
Pt presents with c/o cough since Friday after Thanksgiving. Pt reports she went to UC today and was diagnosed with double pneumonia.

## 2019-01-29 NOTE — ED Notes (Signed)
X-ray at bedside

## 2019-01-30 ENCOUNTER — Inpatient Hospital Stay (HOSPITAL_COMMUNITY): Payer: PRIVATE HEALTH INSURANCE

## 2019-01-30 ENCOUNTER — Encounter (HOSPITAL_COMMUNITY): Payer: Self-pay | Admitting: Internal Medicine

## 2019-01-30 DIAGNOSIS — I10 Essential (primary) hypertension: Secondary | ICD-10-CM | POA: Diagnosis present

## 2019-01-30 DIAGNOSIS — Z8249 Family history of ischemic heart disease and other diseases of the circulatory system: Secondary | ICD-10-CM | POA: Diagnosis not present

## 2019-01-30 DIAGNOSIS — J189 Pneumonia, unspecified organism: Secondary | ICD-10-CM | POA: Diagnosis not present

## 2019-01-30 DIAGNOSIS — J1289 Other viral pneumonia: Secondary | ICD-10-CM | POA: Diagnosis present

## 2019-01-30 DIAGNOSIS — Z79899 Other long term (current) drug therapy: Secondary | ICD-10-CM | POA: Diagnosis not present

## 2019-01-30 DIAGNOSIS — Z888 Allergy status to other drugs, medicaments and biological substances status: Secondary | ICD-10-CM | POA: Diagnosis not present

## 2019-01-30 DIAGNOSIS — Z87891 Personal history of nicotine dependence: Secondary | ICD-10-CM | POA: Diagnosis not present

## 2019-01-30 DIAGNOSIS — U071 COVID-19: Secondary | ICD-10-CM | POA: Diagnosis present

## 2019-01-30 DIAGNOSIS — K219 Gastro-esophageal reflux disease without esophagitis: Secondary | ICD-10-CM | POA: Diagnosis present

## 2019-01-30 DIAGNOSIS — J9601 Acute respiratory failure with hypoxia: Secondary | ICD-10-CM | POA: Diagnosis present

## 2019-01-30 DIAGNOSIS — Z791 Long term (current) use of non-steroidal anti-inflammatories (NSAID): Secondary | ICD-10-CM | POA: Diagnosis not present

## 2019-01-30 DIAGNOSIS — Z9114 Patient's other noncompliance with medication regimen: Secondary | ICD-10-CM | POA: Diagnosis not present

## 2019-01-30 DIAGNOSIS — R0602 Shortness of breath: Secondary | ICD-10-CM | POA: Diagnosis present

## 2019-01-30 DIAGNOSIS — Z6841 Body Mass Index (BMI) 40.0 and over, adult: Secondary | ICD-10-CM | POA: Diagnosis not present

## 2019-01-30 LAB — RESPIRATORY PANEL BY PCR

## 2019-01-30 LAB — LACTATE DEHYDROGENASE: LDH: 320 U/L — ABNORMAL HIGH (ref 98–192)

## 2019-01-30 LAB — CBC
HCT: 37.9 % (ref 36.0–46.0)
HCT: 41 % (ref 36.0–46.0)
Hemoglobin: 12.5 g/dL (ref 12.0–15.0)
Hemoglobin: 13.4 g/dL (ref 12.0–15.0)
MCH: 29.6 pg (ref 26.0–34.0)
MCH: 29.3 pg (ref 26.0–34.0)
MCHC: 33 g/dL (ref 30.0–36.0)
MCHC: 32.7 g/dL (ref 30.0–36.0)
MCV: 89.6 fL (ref 80.0–100.0)
MCV: 89.7 fL (ref 80.0–100.0)
Platelets: 217 10*3/uL (ref 150–400)
Platelets: 210 10*3/uL (ref 150–400)
RBC: 4.23 MIL/uL (ref 3.87–5.11)
RBC: 4.57 MIL/uL (ref 3.87–5.11)
RDW: 13.3 % (ref 11.5–15.5)
RDW: 13.4 % (ref 11.5–15.5)
WBC: 6.1 10*3/uL (ref 4.0–10.5)
WBC: 5.6 10*3/uL (ref 4.0–10.5)
nRBC: 0 % (ref 0.0–0.2)
nRBC: 0 % (ref 0.0–0.2)

## 2019-01-30 LAB — COMPREHENSIVE METABOLIC PANEL
ALT: 20 U/L (ref 0–44)
AST: 33 U/L (ref 15–41)
Albumin: 3.2 g/dL — ABNORMAL LOW (ref 3.5–5.0)
Alkaline Phosphatase: 61 U/L (ref 38–126)
Anion gap: 15 (ref 5–15)
BUN: 7 mg/dL (ref 6–20)
CO2: 25 mmol/L (ref 22–32)
Calcium: 8.6 mg/dL — ABNORMAL LOW (ref 8.9–10.3)
Chloride: 97 mmol/L — ABNORMAL LOW (ref 98–111)
Creatinine, Ser: 0.74 mg/dL (ref 0.44–1.00)
GFR calc Af Amer: >60 mL/min (ref >60)
GFR calc non Af Amer: >60 mL/min (ref >60)
Glucose, Bld: 134 mg/dL — ABNORMAL HIGH (ref 70–99)
Potassium: 3.2 mmol/L — ABNORMAL LOW (ref 3.5–5.1)
Sodium: 137 mmol/L (ref 135–145)
Total Bilirubin: 0.3 mg/dL (ref 0.3–1.2)
Total Protein: 8 g/dL (ref 6.5–8.1)

## 2019-01-30 LAB — HIV ANTIBODY (ROUTINE TESTING W REFLEX): HIV Screen 4th Generation wRfx: NONREACTIVE

## 2019-01-30 LAB — ABO/RH: ABO/RH(D): B POS

## 2019-01-30 LAB — TRIGLYCERIDES: Triglycerides: 89 mg/dL (ref <150)

## 2019-01-30 LAB — PROCALCITONIN: Procalcitonin: <0.1 ng/mL

## 2019-01-30 LAB — SARS CORONAVIRUS 2 (TAT 6-24 HRS): SARS Coronavirus 2: POSITIVE — AB

## 2019-01-30 LAB — CREATININE, SERUM
Creatinine, Ser: 0.78 mg/dL (ref 0.44–1.00)
GFR calc Af Amer: >60 mL/min (ref >60)
GFR calc non Af Amer: >60 mL/min (ref >60)

## 2019-01-30 MED ORDER — ENOXAPARIN SODIUM 40 MG/0.4ML ~~LOC~~ SOLN
40.0000 mg | SUBCUTANEOUS | Status: DC
  Administered 2019-01-30 – 2019-01-31 (×2): 40 mg via SUBCUTANEOUS
  Filled 2019-01-30 (×2): qty 0.4

## 2019-01-30 MED ORDER — DOCUSATE SODIUM 100 MG PO CAPS
100.0000 mg | ORAL_CAPSULE | Freq: Every day | ORAL | Status: DC
  Administered 2019-01-30 – 2019-02-01 (×2): 100 mg via ORAL
  Filled 2019-01-30 (×4): qty 1

## 2019-01-30 MED ORDER — PANTOPRAZOLE SODIUM 40 MG PO TBEC: 40.0000 mg | DELAYED_RELEASE_TABLET | Freq: Every evening | ORAL | Status: DC | PRN

## 2019-01-30 MED ORDER — SENNOSIDES-DOCUSATE SODIUM 8.6-50 MG PO TABS: 1.0000 | ORAL_TABLET | Freq: Every evening | ORAL | Status: DC | PRN

## 2019-01-30 MED ORDER — ONDANSETRON HCL 4 MG/2ML IJ SOLN
4.0000 mg | Freq: Four times a day (QID) | INTRAMUSCULAR | Status: DC | PRN
  Filled 2019-01-30: qty 2

## 2019-01-30 MED ORDER — LABETALOL HCL 5 MG/ML IV SOLN: 10.0000 mg | INTRAVENOUS | Status: DC | PRN

## 2019-01-30 MED ORDER — SODIUM CHLORIDE 0.9 % IV SOLN
200.0000 mg | Freq: Once | INTRAVENOUS | Status: AC
  Administered 2019-01-30: 12:00:00 200 mg via INTRAVENOUS
  Filled 2019-01-30: qty 200

## 2019-01-30 MED ORDER — SODIUM CHLORIDE 0.9 % IV SOLN
2.0000 g | Freq: Once | INTRAVENOUS | Status: AC
  Administered 2019-01-30: 2 g via INTRAVENOUS
  Filled 2019-01-30: qty 20

## 2019-01-30 MED ORDER — POTASSIUM CHLORIDE CRYS ER 20 MEQ PO TBCR
40.0000 meq | EXTENDED_RELEASE_TABLET | Freq: Once | ORAL | Status: AC
  Administered 2019-01-30: 40 meq via ORAL
  Filled 2019-01-30: qty 2

## 2019-01-30 MED ORDER — HYDROCHLOROTHIAZIDE 25 MG PO TABS
25.0000 mg | ORAL_TABLET | Freq: Every day | ORAL | Status: DC
  Administered 2019-01-30 – 2019-01-31 (×2): 25 mg via ORAL
  Filled 2019-01-30 (×2): qty 1

## 2019-01-30 MED ORDER — TRAMADOL HCL 50 MG PO TABS
50.0000 mg | ORAL_TABLET | Freq: Once | ORAL | Status: AC
  Administered 2019-01-30: 06:00:00 50 mg via ORAL
  Filled 2019-01-30: qty 1

## 2019-01-30 MED ORDER — SODIUM CHLORIDE 0.9 % IV SOLN: 2.0000 g | INTRAVENOUS | Status: DC

## 2019-01-30 MED ORDER — SODIUM CHLORIDE 0.9 % IV SOLN
100.0000 mg | Freq: Every day | INTRAVENOUS | Status: AC
  Administered 2019-01-31 – 2019-02-03 (×4): 100 mg via INTRAVENOUS
  Filled 2019-01-30 (×4): qty 20

## 2019-01-30 MED ORDER — DEXAMETHASONE SODIUM PHOSPHATE 10 MG/ML IJ SOLN
6.0000 mg | Freq: Every day | INTRAMUSCULAR | Status: DC
  Administered 2019-01-30 – 2019-02-03 (×5): 6 mg via INTRAVENOUS
  Filled 2019-01-30 (×5): qty 1

## 2019-01-30 MED ORDER — TEMAZEPAM 15 MG PO CAPS
30.0000 mg | ORAL_CAPSULE | Freq: Every evening | ORAL | Status: DC | PRN
  Administered 2019-01-31 – 2019-02-02 (×4): 30 mg via ORAL
  Filled 2019-01-30 (×4): qty 2
  Filled 2019-01-30: qty 1
  Filled 2019-01-30 (×3): qty 2

## 2019-01-30 MED ORDER — ZINC SULFATE 220 (50 ZN) MG PO CAPS
220.0000 mg | ORAL_CAPSULE | Freq: Every day | ORAL | Status: DC
  Administered 2019-01-30 – 2019-02-03 (×5): 220 mg via ORAL
  Filled 2019-01-30 (×5): qty 1

## 2019-01-30 MED ORDER — VITAMIN C 500 MG PO TABS
500.0000 mg | ORAL_TABLET | Freq: Every day | ORAL | Status: DC
  Administered 2019-01-30 – 2019-02-03 (×5): 500 mg via ORAL
  Filled 2019-01-30 (×5): qty 1

## 2019-01-30 MED ORDER — METOPROLOL TARTRATE 25 MG PO TABS
25.0000 mg | ORAL_TABLET | Freq: Every day | ORAL | Status: DC
  Administered 2019-01-30 – 2019-01-31 (×2): 25 mg via ORAL
  Filled 2019-01-30 (×3): qty 1

## 2019-01-30 MED ORDER — SODIUM CHLORIDE 0.9 % IV SOLN: 500.0000 mg | INTRAVENOUS | Status: DC

## 2019-01-30 MED ORDER — LOSARTAN POTASSIUM 25 MG PO TABS
100.0000 mg | ORAL_TABLET | Freq: Every day | ORAL | Status: DC
  Administered 2019-01-30 – 2019-02-03 (×5): 100 mg via ORAL
  Filled 2019-01-30: qty 4
  Filled 2019-01-30: qty 2
  Filled 2019-01-30 (×3): qty 4

## 2019-01-30 MED ORDER — ONDANSETRON HCL 4 MG PO TABS: 4.0000 mg | ORAL_TABLET | Freq: Four times a day (QID) | ORAL | Status: DC | PRN

## 2019-01-30 MED ORDER — ACETAMINOPHEN 325 MG PO TABS
650.0000 mg | ORAL_TABLET | Freq: Four times a day (QID) | ORAL | Status: DC | PRN
  Administered 2019-02-01 – 2019-02-02 (×2): 650 mg via ORAL
  Filled 2019-01-30 (×2): qty 2

## 2019-01-30 MED ORDER — SODIUM CHLORIDE 0.9 % IV SOLN
500.0000 mg | Freq: Once | INTRAVENOUS | Status: AC
  Administered 2019-01-30: 500 mg via INTRAVENOUS
  Filled 2019-01-30: qty 500

## 2019-01-30 NOTE — ED Notes (Signed)
Admitting MD at bedside.

## 2019-01-30 NOTE — ED Notes (Signed)
Report called to [facility].  

## 2019-01-30 NOTE — Progress Notes (Signed)
Pt. has arrived as direct admit from [facility].  Patient alert and responsive with no distress noted.  MD notified.

## 2019-01-30 NOTE — Progress Notes (Signed)
PROGRESS NOTE [facility]   Judith Aguirre  FGH:[id number]  DOB: May 10, 1970  DOA: 01/29/2019 PCP: Angelica Chessman, MD   Brief Admission Hx: 48 y.o. female, with morbid obesity, HTN, GERD, presents to the emergency room with 1 week of progressive shortness of breath.  She has been coughing, cough is productive of yellow sputum.  She denies any hemoptysis.  She denies any chest pain, palpitations, orthopnea or PND.  She usually does okay on room air at rest, however when she tries to ambulate she suffers from significant shortness of breath.  She denies any fever, chills or night sweats.  No sick contacts.  No recent travel.  She is a former smoker, no history of COPD/emphysema.  MDM/Assessment & Plan:   1. Acute respiratory failure with hypoxia - Pt is now on 2L [place]. She has Covid 19 infection.  She is being treated supportively and responding to treatments.  To [facility] if bed is available.  2. Covid Pneumonia - Procalcitonin is reassuring.  Will dc antibiotics.  Remdesivir per pharmacy consult, IV decadron ordered, continue supportive care.  3. Essential hypertension - suboptimally controlled, resumed home meds and plan to adjust therapy as needed.  4. Morbid obesity.  5. GERD - continue PPI therapy.   DVT prophylaxis: enoxaparin  Code Status: Full  Family Communication: patient updated bedside, verbalized understanding Disposition Plan: Transfer to [facility]  Consultants:    Procedures:    Antimicrobials:     Subjective: Pt remains SOB but much improved on supplemental oxygen.   Objective: Vitals:   01/30/19 0600 01/30/19 0626 01/30/19 0900 01/30/19 0930  BP: (!) 118/101  (!) 153/72 136/74  Pulse: 91  93 95  Resp:      Temp:      TempSrc:      SpO2: 95% 95% 99% 96%   No intake or output data in the 24 hours ending 01/30/19 1038 There were no vitals filed for this visit.  Exam: General exam: morbidly obese female, awake, alert, NAD Respiratory system:  Mild  tachypnea. mild increased work of breathing. Data Reviewed: Basic Metabolic Panel: Recent Labs  Lab 01/29/19 1916 01/30/19 0554  NA 137 137  K 4.0 3.2*  CL 100 97*  CO2 25 25  GLUCOSE 136* 134*  BUN 7 7  CREATININE 0.77 0.74  CALCIUM 9.0 8.6*   Liver Function Tests: Recent Labs  Lab 01/30/19 0554  AST 33  ALT 20  ALKPHOS 61  BILITOT 0.3  PROT 8.0  ALBUMIN 3.2*   No results for input(s): LIPASE, AMYLASE in the last 168 hours. No results for input(s): AMMONIA in the last 168 hours. CBC: Recent Labs  Lab 01/29/19 1916 01/30/19 0554  WBC 6.8 6.1  NEUTROABS 5.6  --   HGB 14.3 12.5  HCT 42.7 37.9  MCV 89.9 89.6  PLT 211 217   Cardiac Enzymes: No results for input(s): CKTOTAL, CKMB, CKMBINDEX, TROPONINI in the last 168 hours. CBG (last 3)  No results for input(s): GLUCAP in the last 72 hours. Recent Results (from the past 240 hour(s))  SARS CORONAVIRUS 2 (TAT 6-24 HRS) Nasopharyngeal Nasopharyngeal Swab     Status: Abnormal   Collection Time: 01/29/19  8:26 PM   Specimen: Nasopharyngeal Swab  Result Value Ref Range Status   SARS Coronavirus 2 POSITIVE (A) NEGATIVE Final    Comment: RESULT CALLED TO, READ BACK BY AND VERIFIED WITH: T. Doreen Beam [id number] 01/30/2019 T. TYSOR (NOTE) SARS-CoV-2 target nucleic acids are DETECTED. The  SARS-CoV-2 RNA is generally detectable in upper and lower respiratory specimens during the acute phase of infection. Positive results are indicative of the presence of SARS-CoV-2 RNA. Clinical correlation with patient history and other diagnostic information is  necessary to determine patient infection status. Positive results do not rule out bacterial infection or co-infection with other viruses.  The expected result is Negative. Fact Sheet for Patients: SugarRoll.be Fact Sheet for Healthcare Providers: https://www.woods-mathews.com/ This test is not yet approved or cleared by the [place] FDA  and  has been authorized for detection and/or diagnosis of SARS-CoV-2 by FDA under an Emergency Use Authorization (EUA). This EUA will remain  in effect (meaning this test can be used) for t he duration of the COVID-19 declaration under Section 564(b)(1) of the Act, 21 U.S.C. section 360bbb-3(b)(1), unless the authorization is terminated or revoked sooner. Performed at [facility], [place] [address], [place], [place] [address]   Respiratory Panel by PCR     Status: None   Collection Time: 01/30/19  1:24 AM   Specimen: Nasopharyngeal Swab; Respiratory  Result Value Ref Range Status   Adenovirus NOT DETECTED NOT DETECTED Final   Coronavirus 229E NOT DETECTED NOT DETECTED Final    Comment: (NOTE) The Coronavirus on the Respiratory Panel, DOES NOT test for the novel  Coronavirus (2019 nCoV)    Coronavirus HKU1 NOT DETECTED NOT DETECTED Final   Coronavirus NL63 NOT DETECTED NOT DETECTED Final   Coronavirus OC43 NOT DETECTED NOT DETECTED Final   Metapneumovirus NOT DETECTED NOT DETECTED Final   Rhinovirus / Enterovirus NOT DETECTED NOT DETECTED Final   Influenza A NOT DETECTED NOT DETECTED Final   Influenza B NOT DETECTED NOT DETECTED Final   Parainfluenza Virus 1 NOT DETECTED NOT DETECTED Final   Parainfluenza Virus 2 NOT DETECTED NOT DETECTED Final   Parainfluenza Virus 3 NOT DETECTED NOT DETECTED Final   Parainfluenza Virus 4 NOT DETECTED NOT DETECTED Final   Respiratory Syncytial Virus NOT DETECTED NOT DETECTED Final   Bordetella pertussis NOT DETECTED NOT DETECTED Final   Chlamydophila pneumoniae NOT DETECTED NOT DETECTED Final   Mycoplasma pneumoniae NOT DETECTED NOT DETECTED Final    Comment: Performed at [facility], [place]. [address], [place], [place] [address]     Studies: Ct Angio Chest Pe W And/or Wo Contrast  Result Date: 01/29/2019 CLINICAL DATA:  48 year old female with shortness of breath. EXAM: CT ANGIOGRAPHY CHEST WITH CONTRAST TECHNIQUE: Multidetector CT  imaging of the chest was performed using the standard protocol during bolus administration of intravenous contrast. Multiplanar CT image reconstructions and MIPs were obtained to evaluate the vascular anatomy. CONTRAST:  141mL OMNIPAQUE IOHEXOL 350 MG/ML SOLN COMPARISON:  Chest radiograph dated 01/29/2019. FINDINGS: Evaluation is limited due to patient's body habitus and streak artifact caused by patient's arms. Cardiovascular: There is mild cardiomegaly. No pericardial effusion. The thoracic aorta is unremarkable. There is mild dilatation of the main pulmonary trunk suggestive of a degree of pulmonary hypertension. Clinical correlation is recommended. Evaluation for pulmonary embolism is very limited, nondiagnostic due to streak artifact and suboptimal opacification of the pulmonary artery branches. Mediastinum/Nodes: No definite hilar or mediastinal adenopathy. The esophagus and the thyroid gland are grossly unremarkable. No mediastinal fluid collection. Lungs/Pleura: Bilateral upper and lower lobe patchy airspace opacities most consistent with multifocal pneumonia. Clinical correlation and follow-up to resolution recommended. There is no pleural effusion or pneumothorax. The central airways are patent. Upper Abdomen: No acute abnormality. Musculoskeletal: No chest wall abnormality. No acute  or significant osseous findings. Review of the MIP images confirms the above findings. IMPRESSION: 1. Multifocal pneumonia. Clinical correlation and follow-up to resolution recommended. 2. Nondiagnostic evaluation of the pulmonary arteries. 3. Mild cardiomegaly. Electronically Signed   By: Elgie CollardArash  Radparvar M.D.   On: 01/29/2019 23:43   Dg Chest Portable 1 View  Result Date: 01/29/2019 CLINICAL DATA:  Cough. EXAM: PORTABLE CHEST 1 VIEW COMPARISON:  January 29, 2019 FINDINGS: The lung volumes are low. The heart size is enlarged. There are worsening hazy bilateral airspace opacities. There is no pneumothorax. No large pleural  effusion. IMPRESSION: Worsening bilateral airspace opacities concerning for multifocal pneumonia (viral or bacterial). Electronically Signed   By: Katherine Mantlehristopher  Green M.D.   On: 01/29/2019 20:08   Scheduled Meds:  dexamethasone (DECADRON) injection  6 mg Intravenous Q24H   docusate sodium  100 mg Oral Daily   enoxaparin (LOVENOX) injection  40 mg Subcutaneous Q24H   hydrochlorothiazide  25 mg Oral Daily   losartan  100 mg Oral Daily   metoprolol tartrate  25 mg Oral Daily   potassium chloride  40 mEq Oral Once   vitamin C  500 mg Oral Daily   zinc sulfate  220 mg Oral Daily   Continuous Infusions:  remdesivir 200 mg in sodium chloride 0.9% 250 mL IVPB     Followed by   Melene Muller[START ON 01/31/2019] remdesivir 100 mg in NS 100 mL      Principal Problem:   Acute respiratory failure with hypoxemia (HCC) Active Problems:   Hypertension   Morbid obesity due to excess calories (HCC)   GERD (gastroesophageal reflux disease)   Multifocal pneumonia   COVID-19   Time spent:   Standley Dakinslanford Hassan Blackshire, MD [facility] 01/30/2019, 10:38 AM    LOS: 0 days  How to contact the [facility] Attending or Consulting provider 7A - 7P or covering provider during after hours 7P -7A, for this patient?  1. Check the care team in [facility] and look for a) attending/consulting [facility] provider listed and b) the [facility] team listed 2. Log into [email or web address] and use [place]'s universal password to access. If you do not have the password, please contact the hospital operator. 3. Locate the [facility] provider you are looking for under [facility] and page to a number that you can be directly reached. 4. If you still have difficulty reaching the provider, please page the [facility] (Director on Call) for the Hospitalists listed on amion for assistance.

## 2019-01-30 NOTE — ED Notes (Signed)
Pt assisted to bedside commode. Pt while sitting upright was noted to have o2 sats in the mid 80s. Pt bumped up to 4lpm [place]. Pt's lowest o2 sat noted was 76% on 4 liters. RN notified.

## 2019-01-30 NOTE — H&P (Signed)
TRH H&P   Patient Demographics:    Judith Aguirre, is a 48 y.o. female  MRN: [id number]   DOB - 07-Apr-1970  Admit Date - 01/29/2019  Outpatient Primary MD for the patient is Angelica ChessmanAguiar, Rafaela M, MD  Patient coming from: Home  Chief Complaint  Patient presents with   Cough      HPI:    Judith Aguirre  is a 48 y.o. female, with morbid obesity, HTN, GERD, presents to the emergency room with 1 week of progressive shortness of breath.  She has been coughing, cough is productive of yellow sputum.  She denies any hemoptysis.  She denies any chest pain, palpitations, orthopnea or PND.  She usually does okay on room air at rest, however when she tries to ambulate she suffers from significant shortness of breath.  She denies any fever, chills or night sweats.  No sick contacts.  No recent travel.  She is a former smoker, no history of COPD/emphysema.    Review of systems:  Review of Systems:  Constitutional: negative for anorexia, chills, fatigue or fevers HEENT: negative for earaches, epistaxis, or sore throat Respiratory: see HPI Cardiovascular: negative for chest pain, palpitations, or syncope GU: negative for dysuria, urinary frequency, urinary urgency, hematuria Gastrointestinal: negative for abdominal pain, constipation, diarrhea, nausea or vomiting Musculoskeletal: negative for arthralgias, back pain or myalgias Neurological: negative for dizziness, headaches or weakness Behavioral/Psych: negative for suicidal or homicidal ideation Skin:negative for rash Heme: negative for bruises Endo: negative for hair loss, weight gain/loss  With Past History of the following :   Past Medical History:  Diagnosis Date   GERD  (gastroesophageal reflux disease)    Hypertension    Morbid obesity due to excess calories (HCC)       Past Surgical History:  Procedure Laterality Date   DILATION AND CURETTAGE OF UTERUS     KNEE SURGERY       Social History:     Social History   Tobacco Use   Smoking status: Never Smoker   Smokeless tobacco: Never Used  Substance Use Topics   Alcohol use: Yes      Family History :     Family History  Problem Relation Age of Onset   Hypertension Mother      Home Medications:   Prior to Admission medications  Medication Sig Start Date End Date Taking? Authorizing Provider  hydrochlorothiazide (HYDRODIURIL) 25 MG tablet Take 25 mg by mouth daily. 12/10/18  Yes [provider]  ibuprofen (ADVIL) 800 MG tablet Take 800 mg by mouth every 8 (eight) hours as needed for headache, mild pain, moderate pain or cramping.  12/10/18  Yes [provider]  losartan (COZAAR) 100 MG tablet Take 100 mg by mouth daily. 11/16/18  Yes [provider]  metoprolol tartrate (LOPRESSOR) 25 MG tablet Take 25 mg by mouth daily. 12/10/18  Yes [provider]  pantoprazole (PROTONIX) 40 MG tablet Take 40 mg by mouth at bedtime as needed (reflux).  12/10/18  Yes [provider]  temazepam (RESTORIL) 30 MG capsule Take 30 mg by mouth at bedtime as needed for sleep.  12/06/18  Yes [provider]     Allergies:     Allergies  Allergen Reactions   Lisinopril Shortness Of Breath and Other (See Comments)    Cough      Physical Exam:   Vitals  Blood pressure (!) 190/95, pulse 100, temperature 99 F (37.2 C), temperature source Oral, resp. rate (!) 24, last menstrual period 01/03/2019, SpO2 95 %.  Physical Exam   Constitutional - resting comfortably, no acute distress Eyes - pupils equal round and reactive to light and accomodation, extra ocular movements intact Nose - no gross deformity or drainage Mouth - no oral lesions  noted Throat - no swelling or erythema Neck - supple, no JVD   CV - (+)S1S2, no murmurs  Resp -bilateral expiratory rhonchi GI - (+)BS, soft, non-tender, non-distended Extrem - no clubbing, cyanosis, or peripheral edema  Skin - no rashes or wounds Neuro - alert, aware, oriented to person/place/time  Psych - normal affect, no anxiety   Patient has Pressure Ulcer on Admission?: no   Data Review:    CBC Recent Labs  Lab 01/29/19 1916  WBC 6.8  HGB 14.3  HCT 42.7  PLT 211  MCV 89.9  MCH 30.1  MCHC 33.5  RDW 13.2  LYMPHSABS 0.8  MONOABS 0.4  EOSABS 0.0  BASOSABS 0.0   ------------------------------------------------------------------------------------------------------------------  Chemistries  Recent Labs  Lab 01/29/19 1916  NA 137  K 4.0  CL 100  CO2 25  GLUCOSE 136*  BUN 7  CREATININE 0.77  CALCIUM 9.0   ------------------------------------------------------------------------------------------------------------------ CrCl cannot be calculated (Unknown ideal weight.). ------------------------------------------------------------------------------------------------------------------ No results for input(s): TSH, T4TOTAL, T3FREE, THYROIDAB in the last 72 hours.  Invalid input(s): FREET3  Coagulation profile No results for input(s): INR, PROTIME in the last 168 hours. ------------------------------------------------------------------------------------------------------------------- Recent Labs    01/29/19 1916  DDIMER 1.01*   -------------------------------------------------------------------------------------------------------------------  Cardiac Enzymes No results for input(s): CKMB, TROPONINI, MYOGLOBIN in the last 168 hours.  Invalid input(s): CK ------------------------------------------------------------------------------------------------------------------    Component Value Date/Time   BNP 38.8 01/29/2019 1916      ---------------------------------------------------------------------------------------------------------------  Urinalysis    Component Value Date/Time   COLORURINE YELLOW 04/19/2008 1540   APPEARANCEUR CLOUDY (A) 04/19/2008 1540   LABSPEC 1.021 04/19/2008 1540   PHURINE 6.5 04/19/2008 1540   GLUCOSEU NEGATIVE 04/19/2008 1540   HGBUR NEGATIVE 04/19/2008 1540   BILIRUBINUR NEGATIVE 04/19/2008 1540   KETONESUR NEGATIVE 04/19/2008 1540   PROTEINUR NEGATIVE 04/19/2008 1540   UROBILINOGEN 1.0 04/19/2008 1540   NITRITE NEGATIVE 04/19/2008 1540   LEUKOCYTESUR LARGE (A) 04/19/2008 1540    ----------------------------------------------------------------------------------------------------------------   Imaging Results:    Ct Angio Chest Pe W And/or Wo Contrast  Result Date: 01/29/2019 CLINICAL  DATA:  48 year old female with shortness of breath. EXAM: CT ANGIOGRAPHY CHEST WITH CONTRAST TECHNIQUE: Multidetector CT imaging of the chest was performed using the standard protocol during bolus administration of intravenous contrast. Multiplanar CT image reconstructions and MIPs were obtained to evaluate the vascular anatomy. CONTRAST:  OMNIPAQUE IOHEXOL 350 MG/ML SOLN COMPARISON:  Chest radiograph dated 01/29/2019. FINDINGS: Evaluation is limited due to patient's body habitus and streak artifact caused by patient's arms. Cardiovascular: There is mild cardiomegaly. No pericardial effusion. The thoracic aorta is unremarkable. There is mild dilatation of the main pulmonary trunk suggestive of a degree of pulmonary hypertension. Clinical correlation is recommended. Evaluation for pulmonary embolism is very limited, nondiagnostic due to streak artifact and suboptimal opacification of the pulmonary artery branches. Mediastinum/Nodes: No definite hilar or mediastinal adenopathy. The esophagus and the thyroid gland are grossly unremarkable. No mediastinal fluid collection. Lungs/Pleura: Bilateral  upper and lower lobe patchy airspace opacities most consistent with multifocal pneumonia. Clinical correlation and follow-up to resolution recommended. There is no pleural effusion or pneumothorax. The central airways are patent. Upper Abdomen: No acute abnormality. Musculoskeletal: No chest wall abnormality. No acute or significant osseous findings. Review of the MIP images confirms the above findings. IMPRESSION: 1. Multifocal pneumonia. Clinical correlation and follow-up to resolution recommended. 2. Nondiagnostic evaluation of the pulmonary arteries. 3. Mild cardiomegaly. Electronically Signed   By: Elgie Collard M.D.   On: 01/29/2019 23:43   Dg Chest Portable 1 View  Result Date: 01/29/2019 CLINICAL DATA:  Cough. EXAM: PORTABLE CHEST 1 VIEW COMPARISON:  January 29, 2019 FINDINGS: The lung volumes are low. The heart size is enlarged. There are worsening hazy bilateral airspace opacities. There is no pneumothorax. No large pleural effusion. IMPRESSION: Worsening bilateral airspace opacities concerning for multifocal pneumonia (viral or bacterial). Electronically Signed   By: Katherine Mantle M.D.   On: 01/29/2019 20:08    Assessment & Plan:    Principal Problem:   Acute respiratory failure with hypoxemia (HCC) Active Problems:   Multifocal pneumonia   Hypertension   Morbid obesity due to excess calories (HCC)   GERD (gastroesophageal reflux disease)    Acute respiratory failure with hypoxemia/Multifocal pneumonia: Patient presents with progressive shortness of breath, cough over the last couple weeks.  Sats dropped to the mid 80s with ambulation.  She feels better on the oxygen.  CXR/CT PE protocol consistent with multifocal pneumonia.  Multiple SARS-CoV-2 tests are negative.   -Admit, treat for community-acquired pneumonia with Rocephin/azithromycin -Follow-up respiratory bio fire -Wean off supplemental oxygen as tolerated    Hypertension: Blood pressure is above goal.  Patient admits  to noncompliance with her home medications. -Restart your metoprolol, HCTZ, losartan and titrate as necessary -Encourage patient to use NSAIDs sparingly    Morbid obesity due to excess calories:Obesity affects all facets of care.  Likely secondary to excessive caloric intake.  Encourage patient to reduce caloric intake while increasing physical activity and engage in other lifestyle changes that may result in weight loss.     GERD: Symptoms are well controlled on a PPI which we continued this inpatient.  DVT Prophylaxis Lovenox  AM Labs Ordered, also please review Full Orders  Family Communication: Admission, patients condition and plan of care including tests being ordered have been discussed with the patient who indicate understanding and agree with the plan and Code Status.  Code Status Full  Likely DC to  Home  Condition GUARDED    Consults called: None    Admission status: Admit to inpatient  Time spent in minutes : 55   Coletta Memos M.D on 01/30/2019 at 2:20 AM  To page go to [email or web address] - password [email or web address]

## 2019-01-30 NOTE — ED Provider Notes (Signed)
Care assumed from Dr. Perlie Mayo, patient with hypoxia and bilateral pneumonia and elevated D-dimer pending CT angiogram of the chest. CT angiogram shows no evidence of pulmonary emboli, but does show bilateral infiltrates in a pattern strongly suggestive of COVID-19. COVID-19 antigen is negative with PCR test pending. She is started on antibiotics for community-acquired pneumonia. Case is discussed with Dr. Vanita Ingles of [facility], who agrees to admit the patient.  Results for orders placed or performed during the hospital encounter of 58/83/25  Basic metabolic panel  Result Value Ref Range   Sodium 137 135 - 145 mmol/L   Potassium 4.0 3.5 - 5.1 mmol/L   Chloride 100 98 - 111 mmol/L   CO2 25 22 - 32 mmol/L   Glucose, Bld 136 (H) 70 - 99 mg/dL   BUN 7 6 - 20 mg/dL   Creatinine, Ser 0.77 0.44 - 1.00 mg/dL   Calcium 9.0 8.9 - 10.3 mg/dL   GFR calc non Af Amer >60 >60 mL/min   GFR calc Af Amer >60 >60 mL/min   Anion gap 12 5 - 15  CBC with Differential  Result Value Ref Range   WBC 6.8 4.0 - 10.5 K/uL   RBC 4.75 3.87 - 5.11 MIL/uL   Hemoglobin 14.3 12.0 - 15.0 g/dL   HCT 42.7 36.0 - 46.0 %   MCV 89.9 80.0 - 100.0 fL   MCH 30.1 26.0 - 34.0 pg   MCHC 33.5 30.0 - 36.0 g/dL   RDW 13.2 11.5 - 15.5 %   Platelets 211 150 - 400 K/uL   nRBC 0.0 0.0 - 0.2 %   Neutrophils Relative % 82 %   Neutro Abs 5.6 1.7 - 7.7 K/uL   Lymphocytes Relative 12 %   Lymphs Abs 0.8 0.7 - 4.0 K/uL   Monocytes Relative 6 %   Monocytes Absolute 0.4 0.1 - 1.0 K/uL   Eosinophils Relative 0 %   Eosinophils Absolute 0.0 0.0 - 0.5 K/uL   Basophils Relative 0 %   Basophils Absolute 0.0 0.0 - 0.1 K/uL   Immature Granulocytes 0 %   Abs Immature Granulocytes 0.03 0.00 - 0.07 K/uL  Brain natriuretic peptide  Result Value Ref Range   B Natriuretic Peptide 38.8 0.0 - 100.0 pg/mL  D-dimer, quantitative (not at [facility])  Result Value Ref Range   D-Dimer, Quant 1.01 (H) 0.00 - 0.50 ug/mL-FEU  Ferritin  Result Value Ref  Range   Ferritin 204 11 - 307 ng/mL  C-reactive protein  Result Value Ref Range   CRP 19.2 (H) <1.0 mg/dL  POC SARS Coronavirus 2 Ag-ED - Nasal Swab (BD Veritor Kit)  Result Value Ref Range   SARS Coronavirus 2 Ag NEGATIVE NEGATIVE  I-Stat Beta hCG blood, ED ([facility], [facility], [facility] only)  Result Value Ref Range   I-stat hCG, quantitative <5.0 <5 mIU/mL   Comment 3           Ct Angio Chest Pe W And/or Wo Contrast  Result Date: 01/29/2019 CLINICAL DATA:  48 year old female with shortness of breath. EXAM: CT ANGIOGRAPHY CHEST WITH CONTRAST TECHNIQUE: Multidetector CT imaging of the chest was performed using the standard protocol during bolus administration of intravenous contrast. Multiplanar CT image reconstructions and MIPs were obtained to evaluate the vascular anatomy. CONTRAST:  179m OMNIPAQUE IOHEXOL 350 MG/ML SOLN COMPARISON:  Chest radiograph dated 01/29/2019. FINDINGS: Evaluation is limited due to patient's body habitus and streak artifact caused by patient's arms. Cardiovascular: There is mild cardiomegaly. No pericardial effusion. The thoracic  aorta is unremarkable. There is mild dilatation of the main pulmonary trunk suggestive of a degree of pulmonary hypertension. Clinical correlation is recommended. Evaluation for pulmonary embolism is very limited, nondiagnostic due to streak artifact and suboptimal opacification of the pulmonary artery branches. Mediastinum/Nodes: No definite hilar or mediastinal adenopathy. The esophagus and the thyroid gland are grossly unremarkable. No mediastinal fluid collection. Lungs/Pleura: Bilateral upper and lower lobe patchy airspace opacities most consistent with multifocal pneumonia. Clinical correlation and follow-up to resolution recommended. There is no pleural effusion or pneumothorax. The central airways are patent. Upper Abdomen: No acute abnormality. Musculoskeletal: No chest wall abnormality. No acute or significant osseous findings. Review of the MIP images  confirms the above findings. IMPRESSION: 1. Multifocal pneumonia. Clinical correlation and follow-up to resolution recommended. 2. Nondiagnostic evaluation of the pulmonary arteries. 3. Mild cardiomegaly. Electronically Signed   By: Anner Crete M.D.   On: 01/29/2019 23:43   Dg Chest Portable 1 View  Result Date: 01/29/2019 CLINICAL DATA:  Cough. EXAM: PORTABLE CHEST 1 VIEW COMPARISON:  January 29, 2019 FINDINGS: The lung volumes are low. The heart size is enlarged. There are worsening hazy bilateral airspace opacities. There is no pneumothorax. No large pleural effusion. IMPRESSION: Worsening bilateral airspace opacities concerning for multifocal pneumonia (viral or bacterial). Electronically Signed   By: Constance Holster M.D.   On: 01/29/2019 20:08   Images viewed by me.    Delora Fuel, MD 38/68/54 0130

## 2019-01-30 NOTE — ED Notes (Signed)
[facility] called for transport. 

## 2019-01-31 ENCOUNTER — Other Ambulatory Visit: Payer: Self-pay

## 2019-01-31 DIAGNOSIS — J9601 Acute respiratory failure with hypoxia: Secondary | ICD-10-CM

## 2019-01-31 LAB — CBC WITH DIFFERENTIAL/PLATELET
Abs Immature Granulocytes: 0.02 10*3/uL (ref 0.00–0.07)
Basophils Absolute: 0 10*3/uL (ref 0.0–0.1)
Basophils Relative: 0 %
Eosinophils Absolute: 0 10*3/uL (ref 0.0–0.5)
Eosinophils Relative: 0 %
HCT: 39.1 % (ref 36.0–46.0)
Hemoglobin: 12.9 g/dL (ref 12.0–15.0)
Immature Granulocytes: 0 %
Lymphocytes Relative: 23 %
Lymphs Abs: 1.1 10*3/uL (ref 0.7–4.0)
MCH: 29.3 pg (ref 26.0–34.0)
MCHC: 33 g/dL (ref 30.0–36.0)
MCV: 88.7 fL (ref 80.0–100.0)
Monocytes Absolute: 0.5 10*3/uL (ref 0.1–1.0)
Monocytes Relative: 11 %
Neutro Abs: 3 10*3/uL (ref 1.7–7.7)
Neutrophils Relative %: 66 %
Platelets: 270 10*3/uL (ref 150–400)
RBC: 4.41 MIL/uL (ref 3.87–5.11)
RDW: 13.2 % (ref 11.5–15.5)
WBC: 4.6 10*3/uL (ref 4.0–10.5)
nRBC: 0 % (ref 0.0–0.2)

## 2019-01-31 LAB — LACTATE DEHYDROGENASE: LDH: 370 U/L — ABNORMAL HIGH (ref 98–192)

## 2019-01-31 LAB — COMPREHENSIVE METABOLIC PANEL
ALT: 23 U/L (ref 0–44)
AST: 38 U/L (ref 15–41)
Albumin: 3.2 g/dL — ABNORMAL LOW (ref 3.5–5.0)
Alkaline Phosphatase: 61 U/L (ref 38–126)
Anion gap: 14 (ref 5–15)
BUN: 8 mg/dL (ref 6–20)
CO2: 26 mmol/L (ref 22–32)
Calcium: 8.7 mg/dL — ABNORMAL LOW (ref 8.9–10.3)
Chloride: 97 mmol/L — ABNORMAL LOW (ref 98–111)
Creatinine, Ser: 0.72 mg/dL (ref 0.44–1.00)
GFR calc Af Amer: >60 mL/min (ref >60)
GFR calc non Af Amer: >60 mL/min (ref >60)
Glucose, Bld: 146 mg/dL — ABNORMAL HIGH (ref 70–99)
Potassium: 3.8 mmol/L (ref 3.5–5.1)
Sodium: 137 mmol/L (ref 135–145)
Total Bilirubin: 0.4 mg/dL (ref 0.3–1.2)
Total Protein: 8.2 g/dL — ABNORMAL HIGH (ref 6.5–8.1)

## 2019-01-31 LAB — PHOSPHORUS: Phosphorus: 2.4 mg/dL — ABNORMAL LOW (ref 2.5–4.6)

## 2019-01-31 LAB — FERRITIN: Ferritin: 263 ng/mL (ref 11–307)

## 2019-01-31 LAB — MAGNESIUM: Magnesium: 2 mg/dL (ref 1.7–2.4)

## 2019-01-31 LAB — D-DIMER, QUANTITATIVE: D-Dimer, Quant: 0.95 ug/mL-FEU — ABNORMAL HIGH (ref 0.00–0.50)

## 2019-01-31 LAB — C-REACTIVE PROTEIN: CRP: 19.1 mg/dL — ABNORMAL HIGH (ref <1.0)

## 2019-01-31 MED ORDER — HYDROCOD POLST-CPM POLST ER 10-8 MG/5ML PO SUER
5.0000 mL | Freq: Two times a day (BID) | ORAL | Status: DC | PRN
  Administered 2019-01-31 – 2019-02-02 (×4): 5 mL via ORAL
  Filled 2019-01-31 (×4): qty 5

## 2019-01-31 MED ORDER — FUROSEMIDE 20 MG PO TABS
40.0000 mg | ORAL_TABLET | Freq: Every day | ORAL | Status: DC
  Administered 2019-02-01 – 2019-02-03 (×3): 40 mg via ORAL
  Filled 2019-01-31 (×3): qty 2

## 2019-01-31 MED ORDER — ENOXAPARIN SODIUM 100 MG/ML ~~LOC~~ SOLN
0.5000 mg/kg | SUBCUTANEOUS | Status: DC
  Administered 2019-02-01 – 2019-02-03 (×3): 90 mg via SUBCUTANEOUS
  Filled 2019-01-31 (×3): qty 1

## 2019-01-31 MED ORDER — METOPROLOL TARTRATE 50 MG PO TABS
50.0000 mg | ORAL_TABLET | Freq: Every day | ORAL | Status: DC
  Administered 2019-02-01 – 2019-02-03 (×3): 50 mg via ORAL
  Filled 2019-01-31 (×3): qty 1

## 2019-01-31 NOTE — Progress Notes (Signed)
Pt husband Darius updated and all questions were answered.

## 2019-01-31 NOTE — Progress Notes (Signed)
Judith Aguirre  ZJI:[id number] DOB: November 12, 1970 DOA: 01/29/2019 PCP: Angelica Chessman, MD    Brief Narrative:  48 year old with a history of HTN, GERD, and morbid obesity who presented to the [facility], ED with 1 week of progressive shortness of breath.  Her symptoms were primarily appreciable during exertion.  In the ED she was found to be Covid positive, and a CXR noted bilateral pulmonary infiltrates consistent with Covid pneumonia.  A CTa was attempted but was technically limited and not able to rule out PE but did confirm multifocal pulmonary opacities.   COVID-19 specific Treatment: Remdesivir 12/7 >  Subjective: Oxygen saturations are presently holding steady on 2 L nasal cannula.  Reports that she is feeling better.  Denies chest pain nausea vomiting or abdominal pain.  Assessment & Plan:  COVID Pneumonia - acute hypoxic respiratory failure Continue remdesivir and Decadron - improving nicely - may be a candidate for early discharge with outpatient completion of remdesivir dosing depending on how she does over the next 24 hours - not dosing w/ Actemra as pt appears to be responding favorably to current tx alone   Recent Labs  Lab 01/29/19 1916 01/29/19 2042 01/30/19 0554 01/31/19 0305  DDIMER 1.01*  --   --  0.95*  FERRITIN  --  204  --  263  CRP  --  19.2*  --  19.1*  ALT  --   --  20 23  PROCALCITON  --   --  <0.10  --     HTN Blood pressure elevated at this time -adjust medical therapy and follow  GERD Continue home medical therapy  Morbid obesity - Body mass index is 55.93 kg/m.   DVT prophylaxis: Lovenox Code Status: FULL CODE Family Communication:  Disposition Plan: med/surg - possible d/c 24-48hrs   Consultants:  none  Antimicrobials:  Ceftriaxone 12/6 Azithromycin 12/6  Objective: Blood pressure (!) 146/99, pulse 87, temperature 98.7 F (37.1 C), temperature source Oral, resp. rate 18, height 5\' 11"  (1.803 m), weight (!) 181.9 kg, last menstrual  period 01/03/2019, SpO2 95 %.  Intake/Output Summary (Last 24 hours) at 01/31/2019 0928 Last data filed at 01/31/2019 0800 Gross per 24 hour  Intake 380 ml  Output 0 ml  Net 380 ml   Filed Weights   01/30/19 2224 01/31/19 0545  Weight: (!) 181.9 kg (!) 181.9 kg    Examination: General: No acute respiratory distress Lungs: Clear to auscultation bilaterally without wheezes or crackles -very distant breath sounds Cardiovascular: Regular rate and rhythm without murmur gallop or rub normal S1 and S2 Abdomen: Nontender, morbidly obese, soft, bowel sounds positive, no rebound, no ascites, no appreciable mass Extremities: 1+ bilateral lower extremity edema  CBC: Recent Labs  Lab 01/29/19 1916 01/30/19 0554 01/30/19 1154 01/31/19 0305  WBC 6.8 6.1 5.6 4.6  NEUTROABS 5.6  --   --  3.0  HGB 14.3 12.5 13.4 12.9  HCT 42.7 37.9 41.0 39.1  MCV 89.9 89.6 89.7 88.7  PLT 211 217 210 270   Basic Metabolic Panel: Recent Labs  Lab 01/29/19 1916 01/30/19 0554 01/30/19 1154 01/31/19 0305  NA 137 137  --  137  K 4.0 3.2*  --  3.8  CL 100 97*  --  97*  CO2 25 25  --  26  GLUCOSE 136* 134*  --  146*  BUN 7 7  --  8  CREATININE 0.77 0.74 0.78 0.72  CALCIUM 9.0 8.6*  --  8.7*  MG  --   --   --  2.0  PHOS  --   --   --  2.4*   GFR: Estimated Creatinine Clearance: 156.4 mL/min (by C-G formula based on SCr of 0.72 mg/dL).  Liver Function Tests: Recent Labs  Lab 01/30/19 0554 01/31/19 0305  AST 33 38  ALT 20 23  ALKPHOS 61 61  BILITOT 0.3 0.4  PROT 8.0 8.2*  ALBUMIN 3.2* 3.2*     Recent Results (from the past 240 hour(s))  SARS CORONAVIRUS 2 (TAT 6-24 HRS) Nasopharyngeal Nasopharyngeal Swab     Status: Abnormal   Collection Time: 01/29/19  8:26 PM   Specimen: Nasopharyngeal Swab  Result Value Ref Range Status   SARS Coronavirus 2 POSITIVE (A) NEGATIVE Final    Comment: RESULT CALLED TO, READ BACK BY AND VERIFIED WITH: Doreen Beam. HAMLET,RN [id number] 01/30/2019 T. TYSOR (NOTE)  SARS-CoV-2 target nucleic acids are DETECTED. The SARS-CoV-2 RNA is generally detectable in upper and lower respiratory specimens during the acute phase of infection. Positive results are indicative of the presence of SARS-CoV-2 RNA. Clinical correlation with patient history and other diagnostic information is  necessary to determine patient infection status. Positive results do not rule out bacterial infection or co-infection with other viruses.  The expected result is Negative. Fact Sheet for Patients: HairSlick.nohttps://www.fda.gov/media/138098/download Fact Sheet for Healthcare Providers: quierodirigir.comhttps://www.fda.gov/media/138095/download This test is not yet approved or cleared by the [place] FDA and  has been authorized for detection and/or diagnosis of SARS-CoV-2 by FDA under an Emergency Use Authorization (EUA). This EUA will remain  in effect (meaning this test can be used) for t he duration of the COVID-19 declaration under Section 564(b)(1) of the Act, 21 U.S.C. section 360bbb-3(b)(1), unless the authorization is terminated or revoked sooner. Performed at [facility], [address] [place], [place] [address]   Respiratory Panel by PCR     Status: None   Collection Time: 01/30/19  1:24 AM   Specimen: Nasopharyngeal Swab; Respiratory  Result Value Ref Range Status   Adenovirus NOT DETECTED NOT DETECTED Final   Coronavirus 229E NOT DETECTED NOT DETECTED Final    Comment: (NOTE) The Coronavirus on the Respiratory Panel, DOES NOT test for the novel  Coronavirus (2019 nCoV)    Coronavirus HKU1 NOT DETECTED NOT DETECTED Final   Coronavirus NL63 NOT DETECTED NOT DETECTED Final   Coronavirus OC43 NOT DETECTED NOT DETECTED Final   Metapneumovirus NOT DETECTED NOT DETECTED Final   Rhinovirus / Enterovirus NOT DETECTED NOT DETECTED Final   Influenza A NOT DETECTED NOT DETECTED Final   Influenza B NOT DETECTED NOT DETECTED Final   Parainfluenza Virus 1 NOT DETECTED NOT DETECTED  Final   Parainfluenza Virus 2 NOT DETECTED NOT DETECTED Final   Parainfluenza Virus 3 NOT DETECTED NOT DETECTED Final   Parainfluenza Virus 4 NOT DETECTED NOT DETECTED Final   Respiratory Syncytial Virus NOT DETECTED NOT DETECTED Final   Bordetella pertussis NOT DETECTED NOT DETECTED Final   Chlamydophila pneumoniae NOT DETECTED NOT DETECTED Final   Mycoplasma pneumoniae NOT DETECTED NOT DETECTED Final    Comment: Performed at [facility], [address] [place], [place] [address]     Scheduled Meds: . dexamethasone (DECADRON) injection  6 mg Intravenous Daily  . docusate sodium  100 mg Oral Daily  . enoxaparin (LOVENOX) injection  40 mg Subcutaneous Q24H  . hydrochlorothiazide  25 mg Oral Daily  . losartan  100 mg Oral Daily  . metoprolol tartrate  25 mg Oral Daily  . vitamin C  500 mg Oral Daily  . zinc sulfate  220 mg Oral Daily   Continuous Infusions: . remdesivir 100 mg in NS 100 mL 100 mg (01/31/19 0905)     LOS: 1 day   Cherene Altes, MD [facility] Office  [phone] Pager - Text Page per Shea Evans  If 7PM-7AM, please contact night-coverage per Amion 01/31/2019, 9:28 AM

## 2019-02-01 LAB — COMPREHENSIVE METABOLIC PANEL
ALT: 25 U/L (ref 0–44)
AST: 35 U/L (ref 15–41)
Albumin: 3.4 g/dL — ABNORMAL LOW (ref 3.5–5.0)
Alkaline Phosphatase: 59 U/L (ref 38–126)
Anion gap: 12 (ref 5–15)
BUN: 13 mg/dL (ref 6–20)
CO2: 28 mmol/L (ref 22–32)
Calcium: 8.7 mg/dL — ABNORMAL LOW (ref 8.9–10.3)
Chloride: 97 mmol/L — ABNORMAL LOW (ref 98–111)
Creatinine, Ser: 0.72 mg/dL (ref 0.44–1.00)
GFR calc Af Amer: >60 mL/min (ref >60)
GFR calc non Af Amer: >60 mL/min (ref >60)
Glucose, Bld: 148 mg/dL — ABNORMAL HIGH (ref 70–99)
Potassium: 3.6 mmol/L (ref 3.5–5.1)
Sodium: 137 mmol/L (ref 135–145)
Total Bilirubin: 0.6 mg/dL (ref 0.3–1.2)
Total Protein: 8.3 g/dL — ABNORMAL HIGH (ref 6.5–8.1)

## 2019-02-01 LAB — CBC WITH DIFFERENTIAL/PLATELET
Abs Immature Granulocytes: 0.02 10*3/uL (ref 0.00–0.07)
Basophils Absolute: 0 10*3/uL (ref 0.0–0.1)
Basophils Relative: 0 %
Eosinophils Absolute: 0 10*3/uL (ref 0.0–0.5)
Eosinophils Relative: 0 %
HCT: 39.8 % (ref 36.0–46.0)
Hemoglobin: 13 g/dL (ref 12.0–15.0)
Immature Granulocytes: 0 %
Lymphocytes Relative: 22 %
Lymphs Abs: 1.3 10*3/uL (ref 0.7–4.0)
MCH: 29.1 pg (ref 26.0–34.0)
MCHC: 32.7 g/dL (ref 30.0–36.0)
MCV: 89 fL (ref 80.0–100.0)
Monocytes Absolute: 0.7 10*3/uL (ref 0.1–1.0)
Monocytes Relative: 12 %
Neutro Abs: 3.6 10*3/uL (ref 1.7–7.7)
Neutrophils Relative %: 66 %
Platelets: 360 10*3/uL (ref 150–400)
RBC: 4.47 MIL/uL (ref 3.87–5.11)
RDW: 13.3 % (ref 11.5–15.5)
WBC: 5.6 10*3/uL (ref 4.0–10.5)
nRBC: 0 % (ref 0.0–0.2)

## 2019-02-01 LAB — D-DIMER, QUANTITATIVE: D-Dimer, Quant: 0.74 ug/mL-FEU — ABNORMAL HIGH (ref 0.00–0.50)

## 2019-02-01 LAB — GLUCOSE, CAPILLARY
Glucose-Capillary: 211 mg/dL — ABNORMAL HIGH (ref 70–99)
Glucose-Capillary: 154 mg/dL — ABNORMAL HIGH (ref 70–99)

## 2019-02-01 LAB — C-REACTIVE PROTEIN: CRP: 12.2 mg/dL — ABNORMAL HIGH (ref <1.0)

## 2019-02-01 LAB — MAGNESIUM: Magnesium: 2.2 mg/dL (ref 1.7–2.4)

## 2019-02-01 LAB — LACTATE DEHYDROGENASE: LDH: 342 U/L — ABNORMAL HIGH (ref 98–192)

## 2019-02-01 LAB — FERRITIN: Ferritin: 334 ng/mL — ABNORMAL HIGH (ref 11–307)

## 2019-02-01 LAB — PHOSPHORUS: Phosphorus: 2.6 mg/dL (ref 2.5–4.6)

## 2019-02-01 MED ORDER — INSULIN ASPART 100 UNIT/ML ~~LOC~~ SOLN
0.0000 [IU] | Freq: Three times a day (TID) | SUBCUTANEOUS | Status: DC
  Administered 2019-02-01: 3 [IU] via SUBCUTANEOUS
  Administered 2019-02-02: 1 [IU] via SUBCUTANEOUS
  Administered 2019-02-02: 3 [IU] via SUBCUTANEOUS
  Administered 2019-02-02: 1 [IU] via SUBCUTANEOUS

## 2019-02-01 NOTE — Progress Notes (Signed)
PROGRESS NOTE                                                                                                                                                                                                             Patient Demographics:    Judith Aguirre, is a 48 y.o. female, DOB - 1970-05-14, MVH:[id number]  Outpatient Primary MD for the patient is Angelica Chessman, MD   Admit date - 01/29/2019   LOS - 2  Chief Complaint  Patient presents with  . Cough       Brief Narrative: Patient is a 48 y.o. female with PMHx of HTN, GERD, morbid obesity-who presented with 1 week history of shortness of breath-was found to have acute hypoxic respiratory failure in the setting of COVID-19 pneumonia.   Subjective:    [name] today feels better-she has been titrated down to just 1 L of oxygen.  However she still feels short of breath when she ambulates to the bathroom.   Assessment  & Plan :   Acute Hypoxic Resp Failure due to Covid 19 Viral pneumonia: Improving-continue at attempts to titrate down FiO2.  Remains on steroids and remdesivir.  Inflammatory markers are downtrending.  Given that she is still symptomatic with shortness of breath with ambulation-and that her inflammatory markers are still elevated-I suspect she will need to stay inpatient and complete remdesivir infusion.  Fever: afebrile  O2 requirements:  SpO2: 97 % O2 Flow Rate (L/min): 1 L/min   COVID-19 Labs: Recent Labs    01/29/19 1916 01/29/19 2042 01/30/19 0554 01/31/19 0305 02/01/19 0343  DDIMER 1.01*  --   --  0.95* 0.74*  FERRITIN  --  204  --  263 334*  LDH  --   --  320* 370* 342*  CRP  --  19.2*  --  19.1* 12.2*       Component Value Date/Time   BNP 38.8 01/29/2019 1916    Recent Labs  Lab 01/30/19 0554  PROCALCITON <0.10    Lab Results  Component Value Date   SARSCOV2NAA POSITIVE (A) 01/29/2019     COVID-19 Medications:  Steroids:12/7>> Remdesivir:12/7>>  Other medications: Diuretics:Euvolemic-continue Lasix to maintain negative balance Antibiotics:Not needed as no evidence of bacterial infection Monitor CBGs while on Decadron-check A1c  Prone/Incentive Spirometry: encouraged  incentive spirometry use 3-4/hour.  DVT Prophylaxis  :  Lovenox  HTN: Controlled-continue Lasix, metoprolol and losartan.  GERD: Continue PPI  Obesity: Estimated body mass index is 55.84 kg/m as calculated from the following:   Height as of this encounter: 5\' 11"  (1.803 m).   Weight as of this encounter: 181.6 kg.     Consults  :  None  Procedures  :  None  ABG: No results found for: PHART, PCO2ART, PO2ART, HCO3, TCO2, ACIDBASEDEF, O2SAT  Vent Settings: N/A  Condition - Stable  Family Communication  :  Spouse updated over the phone  Code Status :  Full Code  Diet :  Diet Order            Diet Heart Room service appropriate? Yes; Fluid consistency: Thin  Diet effective now               Disposition Plan  :  Remain hospitalized  Barriers to discharge: Hypoxia requiring O2 supplementation/complete 5 days of IV Remdesivir  Antimicorbials  :    Anti-infectives (From admission, onward)   Start     Dose/Rate Route Frequency Ordered Stop   01/31/19 1000  remdesivir 100 mg in sodium chloride 0.9 % 100 mL IVPB     100 mg 200 mL/hr over 30 Minutes Intravenous Daily 01/30/19 1036 02/04/19 0959   01/30/19 2200  cefTRIAXone (ROCEPHIN) 2 g in sodium chloride 0.9 % 100 mL IVPB  Status:  Discontinued     2 g 200 mL/hr over 30 Minutes Intravenous Every 24 hours 01/30/19 0217 01/30/19 1037   01/30/19 2200  azithromycin (ZITHROMAX) 500 mg in sodium chloride 0.9 % 250 mL IVPB  Status:  Discontinued     500 mg 250 mL/hr over 60 Minutes Intravenous Every 24 hours 01/30/19 0447 01/30/19 1037   01/30/19 1130  remdesivir 200 mg in sodium chloride 0.9% 250 mL IVPB     200 mg 580 mL/hr over 30 Minutes Intravenous Once  01/30/19 1036 01/30/19 1429   01/30/19 0115  cefTRIAXone (ROCEPHIN) 2 g in sodium chloride 0.9 % 100 mL IVPB     2 g 200 mL/hr over 30 Minutes Intravenous  Once 01/30/19 0104 01/30/19 0203   01/30/19 0115  azithromycin (ZITHROMAX) 500 mg in sodium chloride 0.9 % 250 mL IVPB     500 mg 250 mL/hr over 60 Minutes Intravenous  Once 01/30/19 0104 01/30/19 0321      Inpatient Medications  Scheduled Meds: . dexamethasone (DECADRON) injection  6 mg Intravenous Daily  . docusate sodium  100 mg Oral Daily  . enoxaparin (LOVENOX) injection  0.5 mg/kg Subcutaneous Q24H  . furosemide  40 mg Oral Daily  . losartan  100 mg Oral Daily  . metoprolol tartrate  50 mg Oral Daily  . vitamin C  500 mg Oral Daily  . zinc sulfate  220 mg Oral Daily   Continuous Infusions: . remdesivir 100 mg in NS 100 mL 100 mg (02/01/19 1034)   PRN Meds:.acetaminophen, chlorpheniramine-HYDROcodone, labetalol, ondansetron **OR** ondansetron (ZOFRAN) IV, pantoprazole, senna-docusate, temazepam   Time Spent in minutes  25  See all Orders from today for further details   Oren Binet M.D on 02/01/2019 at 1:52 PM  To page go to [email or web address] - use universal password  [facility] -  Office  [phone]    Objective:   Vitals:   01/31/19 1954 02/01/19 0424 02/01/19 0500 02/01/19 0709  BP: (!) 160/90 (!) 151/86  (!) 140/92  Pulse:    79  Resp: 19 20  20   Temp: 99.1  F (37.3 C) 97.6 F (36.4 C)  99 F (37.2 C)  TempSrc: Oral Oral  Oral  SpO2:  94%  97%  Weight:   (!) 181.6 kg   Height:        Wt Readings from Last 3 Encounters:  02/01/19 (!) 181.6 kg  01/08/18 (!) 190.1 kg  09/09/17 (!) 189.6 kg     Intake/Output Summary (Last 24 hours) at 02/01/2019 1352 Last data filed at 02/01/2019 1348 Gross per 24 hour  Intake 1340 ml  Output -  Net 1340 ml     Physical Exam Gen Exam:Alert awake-not in any distress HEENT:atraumatic, normocephalic Chest: B/L clear to auscultation anteriorly  CVS:S1S2 regular Abdomen:soft non tender, non distended Extremities:no edema Neurology: Non focal Skin: no rash   Data Review:    CBC Recent Labs  Lab 01/29/19 1916 01/30/19 0554 01/30/19 1154 01/31/19 0305 02/01/19 0343  WBC 6.8 6.1 5.6 4.6 5.6  HGB 14.3 12.5 13.4 12.9 13.0  HCT 42.7 37.9 41.0 39.1 39.8  PLT 211 217 210 270 360  MCV 89.9 89.6 89.7 88.7 89.0  MCH 30.1 29.6 29.3 29.3 29.1  MCHC 33.5 33.0 32.7 33.0 32.7  RDW 13.2 13.3 13.4 13.2 13.3  LYMPHSABS 0.8  --   --  1.1 1.3  MONOABS 0.4  --   --  0.5 0.7  EOSABS 0.0  --   --  0.0 0.0  BASOSABS 0.0  --   --  0.0 0.0    Chemistries  Recent Labs  Lab 01/29/19 1916 01/30/19 0554 01/30/19 1154 01/31/19 0305 02/01/19 0343  NA 137 137  --  137 137  K 4.0 3.2*  --  3.8 3.6  CL 100 97*  --  97* 97*  CO2 25 25  --  26 28  GLUCOSE 136* 134*  --  146* 148*  BUN 7 7  --  8 13  CREATININE 0.77 0.74 0.78 0.72 0.72  CALCIUM 9.0 8.6*  --  8.7* 8.7*  MG  --   --   --  2.0 2.2  AST  --  33  --  38 35  ALT  --  20  --  23 25  ALKPHOS  --  61  --  61 59  BILITOT  --  0.3  --  0.4 0.6   ------------------------------------------------------------------------------------------------------------------ Recent Labs    01/30/19 0554  TRIG 89    No results found for: HGBA1C ------------------------------------------------------------------------------------------------------------------ No results for input(s): TSH, T4TOTAL, T3FREE, THYROIDAB in the last 72 hours.  Invalid input(s): FREET3 ------------------------------------------------------------------------------------------------------------------ Recent Labs    01/31/19 0305 02/01/19 0343  FERRITIN 263 334*    Coagulation profile No results for input(s): INR, PROTIME in the last 168 hours.  Recent Labs    01/31/19 0305 02/01/19 0343  DDIMER 0.95* 0.74*    Cardiac Enzymes No results for input(s): CKMB, TROPONINI, MYOGLOBIN in the last 168 hours.   Invalid input(s): CK ------------------------------------------------------------------------------------------------------------------    Component Value Date/Time   BNP 38.8 01/29/2019 1916    Micro Results Recent Results (from the past 240 hour(s))  SARS CORONAVIRUS 2 (TAT 6-24 HRS) Nasopharyngeal Nasopharyngeal Swab     Status: Abnormal   Collection Time: 01/29/19  8:26 PM   Specimen: Nasopharyngeal Swab  Result Value Ref Range Status   SARS Coronavirus 2 POSITIVE (A) NEGATIVE Final    Comment: RESULT CALLED TO, READ BACK BY AND VERIFIED WITH: T. Doreen BeamHAMLET,RN [id number] 01/30/2019 T. TYSOR (NOTE) SARS-CoV-2 target nucleic acids are DETECTED. The  SARS-CoV-2 RNA is generally detectable in upper and lower respiratory specimens during the acute phase of infection. Positive results are indicative of the presence of SARS-CoV-2 RNA. Clinical correlation with patient history and other diagnostic information is  necessary to determine patient infection status. Positive results do not rule out bacterial infection or co-infection with other viruses.  The expected result is Negative. Fact Sheet for Patients: HairSlick.no Fact Sheet for Healthcare Providers: quierodirigir.com This test is not yet approved or cleared by the [place] FDA and  has been authorized for detection and/or diagnosis of SARS-CoV-2 by FDA under an Emergency Use Authorization (EUA). This EUA will remain  in effect (meaning this test can be used) for t he duration of the COVID-19 declaration under Section 564(b)(1) of the Act, 21 U.S.C. section 360bbb-3(b)(1), unless the authorization is terminated or revoked sooner. Performed at [facility], [address] [place], [place] [address]   Respiratory Panel by PCR     Status: None   Collection Time: 01/30/19  1:24 AM   Specimen: Nasopharyngeal Swab; Respiratory  Result Value Ref Range Status   Adenovirus NOT  DETECTED NOT DETECTED Final   Coronavirus 229E NOT DETECTED NOT DETECTED Final    Comment: (NOTE) The Coronavirus on the Respiratory Panel, DOES NOT test for the novel  Coronavirus (2019 nCoV)    Coronavirus HKU1 NOT DETECTED NOT DETECTED Final   Coronavirus NL63 NOT DETECTED NOT DETECTED Final   Coronavirus OC43 NOT DETECTED NOT DETECTED Final   Metapneumovirus NOT DETECTED NOT DETECTED Final   Rhinovirus / Enterovirus NOT DETECTED NOT DETECTED Final   Influenza A NOT DETECTED NOT DETECTED Final   Influenza B NOT DETECTED NOT DETECTED Final   Parainfluenza Virus 1 NOT DETECTED NOT DETECTED Final   Parainfluenza Virus 2 NOT DETECTED NOT DETECTED Final   Parainfluenza Virus 3 NOT DETECTED NOT DETECTED Final   Parainfluenza Virus 4 NOT DETECTED NOT DETECTED Final   Respiratory Syncytial Virus NOT DETECTED NOT DETECTED Final   Bordetella pertussis NOT DETECTED NOT DETECTED Final   Chlamydophila pneumoniae NOT DETECTED NOT DETECTED Final   Mycoplasma pneumoniae NOT DETECTED NOT DETECTED Final    Comment: Performed at [facility], [address] [place], [place] [address]    Radiology Reports Ct Angio Chest Pe W And/or Wo Contrast  Result Date: 01/29/2019 CLINICAL DATA:  48 year old female with shortness of breath. EXAM: CT ANGIOGRAPHY CHEST WITH CONTRAST TECHNIQUE: Multidetector CT imaging of the chest was performed using the standard protocol during bolus administration of intravenous contrast. Multiplanar CT image reconstructions and MIPs were obtained to evaluate the vascular anatomy. CONTRAST:  OMNIPAQUE IOHEXOL 350 MG/ML SOLN COMPARISON:  Chest radiograph dated 01/29/2019. FINDINGS: Evaluation is limited due to patient's body habitus and streak artifact caused by patient's arms. Cardiovascular: There is mild cardiomegaly. No pericardial effusion. The thoracic aorta is unremarkable. There is mild dilatation of the main pulmonary trunk suggestive of a degree of pulmonary  hypertension. Clinical correlation is recommended. Evaluation for pulmonary embolism is very limited, nondiagnostic due to streak artifact and suboptimal opacification of the pulmonary artery branches. Mediastinum/Nodes: No definite hilar or mediastinal adenopathy. The esophagus and the thyroid gland are grossly unremarkable. No mediastinal fluid collection. Lungs/Pleura: Bilateral upper and lower lobe patchy airspace opacities most consistent with multifocal pneumonia. Clinical correlation and follow-up to resolution recommended. There is no pleural effusion or pneumothorax. The central airways are patent. Upper Abdomen: No acute abnormality. Musculoskeletal: No chest wall abnormality. No acute  or significant osseous findings. Review of the MIP images confirms the above findings. IMPRESSION: 1. Multifocal pneumonia. Clinical correlation and follow-up to resolution recommended. 2. Nondiagnostic evaluation of the pulmonary arteries. 3. Mild cardiomegaly. Electronically Signed   By: Elgie Collard M.D.   On: 01/29/2019 23:43   Dg Chest Port 1 View  Result Date: 01/30/2019 CLINICAL DATA:  Shortness of breath, pneumonia EXAM: PORTABLE CHEST 1 VIEW COMPARISON:  01/29/19 FINDINGS: Low lung volumes. Diffuse bilateral opacities. No significant pleural effusion. No pneumothorax. Stable cardiomediastinal contours. IMPRESSION: Similar appearance of multifocal pneumonia. Electronically Signed   By: Guadlupe Spanish M.D.   On: 01/30/2019 16:04   Dg Chest Portable 1 View  Result Date: 01/29/2019 CLINICAL DATA:  Cough. EXAM: PORTABLE CHEST 1 VIEW COMPARISON:  January 29, 2019 FINDINGS: The lung volumes are low. The heart size is enlarged. There are worsening hazy bilateral airspace opacities. There is no pneumothorax. No large pleural effusion. IMPRESSION: Worsening bilateral airspace opacities concerning for multifocal pneumonia (viral or bacterial). Electronically Signed   By: Katherine Mantle M.D.   On: 01/29/2019  20:08

## 2019-02-01 NOTE — Evaluation (Signed)
Physical Therapy Evaluation & Discharge Patient Details Name: Judith Aguirre MRN: [id number] DOB: 11/12/1970 Today's Date: 02/01/2019   History of Present Illness  Pt is a 48 y.o. female admitted 01/29/19 with 1-wk h/o SOB; found to have acute hypoxic respiratory failure in setting of COVID-19 PNA. PMH includes obesity, HTN.    Clinical Impression  Patient evaluated by Physical Therapy with no further acute PT needs identified. PTA, pt independent, works and lives with family. Today, pt independent with mobility and ADL tasks; mild SOB with activity, SpO2 >/91% on RA, quick to return to 99-100% with seated rest. Educ re: importance of mobility, activity recommendations (aerobic/strength), energy conservation, breathing strategies. All education has been completed and the patient has no further questions. Acute PT is signing off. Thank you for this referral.    Follow Up Recommendations No PT follow up    Equipment Recommendations  Other (comment)(bariatric shower seat)    Recommendations for Other Services       Precautions / Restrictions Precautions Precautions: None Restrictions Weight Bearing Restrictions: No      Mobility  Bed Mobility Overal bed mobility: Modified Independent             General bed mobility comments: HOB elevated  Transfers Overall transfer level: Independent Equipment used: None             General transfer comment: Indep standing from EOB and toilet  Ambulation/Gait Ambulation/Gait assistance: Independent Gait Distance (Feet): 50 Feet Assistive device: None Gait Pattern/deviations: Step-through pattern;Decreased stride length   Gait velocity interpretation: 1.31 - 2.62 ft/sec, indicative of limited community ambulator General Gait Details: Amb 96' + 59' with 1x seated rest break due to DOE 2/4; SpO2 down to 91% on RA.Marland Kitchen Declined hallway ambulation  Stairs            Wheelchair Mobility    Modified Rankin (Stroke Patients  Only)       Balance Overall balance assessment: No apparent balance deficits (not formally assessed)                                           Pertinent Vitals/Pain Pain Assessment: No/denies pain    Home Living Family/patient expects to be discharged to:: Private residence Living Arrangements: Spouse/significant other;Children Available Help at Discharge: Family;Available PRN/intermittently Type of Home: House Home Access: Stairs to enter Entrance Stairs-Rails: Right Entrance Stairs-Number of Steps: 3 Home Layout: One level Home Equipment: None Additional Comments: Husband works as Art gallery manager but is a phone call away; 77 y.o. son taking high school classes online    Prior Function Level of Independence: Independent         Comments: Drives, works as Network engineer for [employer]; [place] for pickup from [employer]        Extremity/Trunk Assessment   Upper Extremity Assessment Upper Extremity Assessment: Overall WFL for tasks assessed    Lower Extremity Assessment Lower Extremity Assessment: Overall WFL for tasks assessed       Communication   Communication: No difficulties  Cognition Arousal/Alertness: Awake/alert Behavior During Therapy: WFL for tasks assessed/performed Overall Cognitive Status: Within Functional Limits for tasks assessed  General Comments General comments (skin integrity, edema, etc.): Educ re: energy conservation (handouts provided), deep breathing, importance of mobility (therex including sit<>stand, walking, seated LE), return to activity recs (aerobic/strengthening)    Exercises     Assessment/Plan    PT Assessment Patent does not need any further PT services  PT Problem List         PT Treatment Interventions      PT Goals (Current goals can be found in the Care Plan section)  Acute Rehab PT Goals PT Goal  Formulation: All assessment and education complete, DC therapy    Frequency     Barriers to discharge        Co-evaluation               AM-PAC PT "6 Clicks" Mobility  Outcome Measure Help needed turning from your back to your side while in a flat bed without using bedrails?: None Help needed moving from lying on your back to sitting on the side of a flat bed without using bedrails?: None Help needed moving to and from a bed to a chair (including a wheelchair)?: None Help needed standing up from a chair using your arms (e.g., wheelchair or bedside chair)?: None Help needed to walk in hospital room?: None Help needed climbing 3-5 steps with a railing? : A Little 6 Click Score: 23    End of Session   Activity Tolerance: Patient tolerated treatment well Patient left: with call bell/phone within reach(seated EOB) Nurse Communication: Mobility status PT Visit Diagnosis: Other abnormalities of gait and mobility (R26.89)    Time: 6834-1962 PT Time Calculation (min) (ACUTE ONLY): 28 min   Charges:   PT Evaluation $PT Eval Moderate Complexity: 1 Mod PT Treatments $Self Care/Home Management: 8-22   Ina Homes, PT, DPT Acute Rehabilitation Services  Pager [phone] Office [phone]  Malachy Chamber 02/01/2019, 5:41 PM

## 2019-02-02 LAB — COMPREHENSIVE METABOLIC PANEL
ALT: 23 U/L (ref 0–44)
AST: 24 U/L (ref 15–41)
Albumin: 3.2 g/dL — ABNORMAL LOW (ref 3.5–5.0)
Alkaline Phosphatase: 53 U/L (ref 38–126)
Anion gap: 14 (ref 5–15)
BUN: 14 mg/dL (ref 6–20)
CO2: 27 mmol/L (ref 22–32)
Calcium: 8.5 mg/dL — ABNORMAL LOW (ref 8.9–10.3)
Chloride: 96 mmol/L — ABNORMAL LOW (ref 98–111)
Creatinine, Ser: 0.72 mg/dL (ref 0.44–1.00)
GFR calc Af Amer: >60 mL/min (ref >60)
GFR calc non Af Amer: >60 mL/min (ref >60)
Glucose, Bld: 133 mg/dL — ABNORMAL HIGH (ref 70–99)
Potassium: 3.3 mmol/L — ABNORMAL LOW (ref 3.5–5.1)
Sodium: 137 mmol/L (ref 135–145)
Total Bilirubin: 0.5 mg/dL (ref 0.3–1.2)
Total Protein: 8.1 g/dL (ref 6.5–8.1)

## 2019-02-02 LAB — GLUCOSE, CAPILLARY
Glucose-Capillary: 137 mg/dL — ABNORMAL HIGH (ref 70–99)
Glucose-Capillary: 149 mg/dL — ABNORMAL HIGH (ref 70–99)
Glucose-Capillary: 202 mg/dL — ABNORMAL HIGH (ref 70–99)
Glucose-Capillary: 158 mg/dL — ABNORMAL HIGH (ref 70–99)

## 2019-02-02 LAB — CBC WITH DIFFERENTIAL/PLATELET
Abs Immature Granulocytes: 0.02 10*3/uL (ref 0.00–0.07)
Basophils Absolute: 0 10*3/uL (ref 0.0–0.1)
Basophils Relative: 0 %
Eosinophils Absolute: 0 10*3/uL (ref 0.0–0.5)
Eosinophils Relative: 0 %
HCT: 40.2 % (ref 36.0–46.0)
Hemoglobin: 13.1 g/dL (ref 12.0–15.0)
Immature Granulocytes: 0 %
Lymphocytes Relative: 27 %
Lymphs Abs: 1.7 10*3/uL (ref 0.7–4.0)
MCH: 29.2 pg (ref 26.0–34.0)
MCHC: 32.6 g/dL (ref 30.0–36.0)
MCV: 89.5 fL (ref 80.0–100.0)
Monocytes Absolute: 0.8 10*3/uL (ref 0.1–1.0)
Monocytes Relative: 12 %
Neutro Abs: 3.8 10*3/uL (ref 1.7–7.7)
Neutrophils Relative %: 61 %
Platelets: 403 10*3/uL — ABNORMAL HIGH (ref 150–400)
RBC: 4.49 MIL/uL (ref 3.87–5.11)
RDW: 13.2 % (ref 11.5–15.5)
WBC: 6.3 10*3/uL (ref 4.0–10.5)
nRBC: 0 % (ref 0.0–0.2)

## 2019-02-02 LAB — FERRITIN: Ferritin: 274 ng/mL (ref 11–307)

## 2019-02-02 LAB — MAGNESIUM: Magnesium: 2.3 mg/dL (ref 1.7–2.4)

## 2019-02-02 LAB — C-REACTIVE PROTEIN: CRP: 6.8 mg/dL — ABNORMAL HIGH (ref <1.0)

## 2019-02-02 LAB — D-DIMER, QUANTITATIVE: D-Dimer, Quant: 0.58 ug/mL-FEU — ABNORMAL HIGH (ref 0.00–0.50)

## 2019-02-02 LAB — PHOSPHORUS: Phosphorus: 2.5 mg/dL (ref 2.5–4.6)

## 2019-02-02 LAB — LACTATE DEHYDROGENASE: LDH: 287 U/L — ABNORMAL HIGH (ref 98–192)

## 2019-02-02 LAB — HEMOGLOBIN A1C
Hgb A1c MFr Bld: 6.1 % — ABNORMAL HIGH (ref 4.8–5.6)
Mean Plasma Glucose: 128.37 mg/dL

## 2019-02-02 MED ORDER — POTASSIUM CHLORIDE CRYS ER 20 MEQ PO TBCR
40.0000 meq | EXTENDED_RELEASE_TABLET | ORAL | Status: AC
  Administered 2019-02-02 (×2): 40 meq via ORAL
  Filled 2019-02-02 (×2): qty 2

## 2019-02-02 NOTE — Progress Notes (Signed)
PROGRESS NOTE                                                                                                                                                                                                             Patient Demographics:    Judith Aguirre, is a 48 y.o. female, DOB - February 03, 1971, ZOX:[id number]  Outpatient Primary MD for the patient is Angelica Chessman, MD   Admit date - 01/29/2019   LOS - 3  Chief Complaint  Patient presents with  . Cough       Brief Narrative: Patient is a 48 y.o. female with PMHx of HTN, GERD, morbid obesity-who presented with 1 week history of shortness of breath-was found to have acute hypoxic respiratory failure in the setting of COVID-19 pneumonia.   Subjective:   She feels much better-she was titrated down to room air earlier this morning.  She is moving much better with less shortness of breath.   Assessment  & Plan :   Acute Hypoxic Resp Failure due to Covid 19 Viral pneumonia: Much improved-was titrated down to room air.  Inflammatory markers trending down.  Continue steroids and remdesivir.   Fever: afebrile  O2 requirements:  SpO2: 100 % O2 Flow Rate (L/min): 1 L/min   COVID-19 Labs: Recent Labs    01/31/19 0305 02/01/19 0343 02/02/19 0245  DDIMER 0.95* 0.74* 0.58*  FERRITIN 263 334* 274  LDH 370* 342* 287*  CRP 19.1* 12.2* 6.8*       Component Value Date/Time   BNP 38.8 01/29/2019 1916    Recent Labs  Lab 01/30/19 0554  PROCALCITON <0.10    Lab Results  Component Value Date   SARSCOV2NAA POSITIVE (A) 01/29/2019     COVID-19 Medications: Steroids:12/7>> Remdesivir:12/7>>  Other medications: Diuretics:Euvolemic-continue Lasix to maintain negative balance Antibiotics:Not needed as no evidence of bacterial infection Insulin: CBG stable on SSI-monitor closely as patient on steroids.  A1c 6.1.  Prone/Incentive Spirometry: encouraged  incentive  spirometry use 3-4/hour.  DVT Prophylaxis  :  Lovenox  HTN: Controlled-continue Lasix, metoprolol and losartan.  GERD: Continue PPI  Obesity: Estimated body mass index is 57.41 kg/m as calculated from the following:   Height as of this encounter:  (1.803 m).   Weight as of this encounter: 186.7 kg.     Consults  :  None  Procedures  :  None  ABG: No results found for: PHART, PCO2ART, PO2ART, HCO3, TCO2, ACIDBASEDEF, O2SAT  Vent Settings: N/A  Condition - Stable  Family Communication  :  Spouse updated over the phone on 12/10  Code Status :  Full Code  Diet :  Diet Order            Diet Heart Room service appropriate? Yes; Fluid consistency: Thin  Diet effective now               Disposition Plan  :  Remain hospitalized  Barriers to discharge: Hypoxia requiring O2 supplementation/complete 5 days of IV Remdesivir  Antimicorbials  :    Anti-infectives (From admission, onward)   Start     Dose/Rate Route Frequency Ordered Stop   01/31/19 1000  remdesivir 100 mg in sodium chloride 0.9 % 100 mL IVPB     100 mg 200 mL/hr over 30 Minutes Intravenous Daily 01/30/19 1036 02/04/19 0959   01/30/19 2200  cefTRIAXone (ROCEPHIN) 2 g in sodium chloride 0.9 % 100 mL IVPB  Status:  Discontinued     2 g 200 mL/hr over 30 Minutes Intravenous Every 24 hours 01/30/19 0217 01/30/19 1037   01/30/19 2200  azithromycin (ZITHROMAX) 500 mg in sodium chloride 0.9 % 250 mL IVPB  Status:  Discontinued     500 mg 250 mL/hr over 60 Minutes Intravenous Every 24 hours 01/30/19 0447 01/30/19 1037   01/30/19 1130  remdesivir 200 mg in sodium chloride 0.9% 250 mL IVPB     200 mg 580 mL/hr over 30 Minutes Intravenous Once 01/30/19 1036 01/30/19 1429   01/30/19 0115  cefTRIAXone (ROCEPHIN) 2 g in sodium chloride 0.9 % 100 mL IVPB     2 g 200 mL/hr over 30 Minutes Intravenous  Once 01/30/19 0104 01/30/19 0203   01/30/19 0115  azithromycin (ZITHROMAX) 500 mg in sodium chloride 0.9 % 250 mL  IVPB     500 mg 250 mL/hr over 60 Minutes Intravenous  Once 01/30/19 0104 01/30/19 0321      Inpatient Medications  Scheduled Meds: . dexamethasone (DECADRON) injection  6 mg Intravenous Daily  . docusate sodium  100 mg Oral Daily  . enoxaparin (LOVENOX) injection  0.5 mg/kg Subcutaneous Q24H  . furosemide  40 mg Oral Daily  . insulin aspart  0-9 Units Subcutaneous TID WC  . losartan  100 mg Oral Daily  . metoprolol tartrate  50 mg Oral Daily  . vitamin C  500 mg Oral Daily  . zinc sulfate  220 mg Oral Daily   Continuous Infusions: . remdesivir 100 mg in NS 100 mL 100 mg (02/02/19 1005)   PRN Meds:.acetaminophen, chlorpheniramine-HYDROcodone, labetalol, ondansetron **OR** ondansetron (ZOFRAN) IV, pantoprazole, senna-docusate, temazepam   Time Spent in minutes  25  See all Orders from today for further details   Oren Binet M.D on 02/02/2019 at 1:20 PM  To page go to [email or web address] - use universal password  [facility] -  Office  [phone]    Objective:   Vitals:   02/01/19 1853 02/01/19 2033 02/02/19 0434 02/02/19 0735  BP: (!) 152/100 (!) 138/93 (!) 143/89 (!) 154/80  Pulse: 80 73 73 75  Resp: 19 20 20    Temp: 98.3 F (36.8 C) 98.4 F (36.9 C) 97.9 F (36.6 C) 97.6 F (36.4 C)  TempSrc: Oral Oral Oral Oral  SpO2: 98% 99% 93% 100%  Weight:   (!) 186.7 kg   Height:  Wt Readings from Last 3 Encounters:  02/02/19 (!) 186.7 kg  01/08/18 (!) 190.1 kg  09/09/17 (!) 189.6 kg     Intake/Output Summary (Last 24 hours) at 02/02/2019 1320 Last data filed at 02/02/2019 1140 Gross per 24 hour  Intake 720 ml  Output -  Net 720 ml     Physical Exam Gen Exam:Alert awake-not in any distress HEENT:atraumatic, normocephalic Chest: B/L clear to auscultation anteriorly CVS:S1S2 regular Abdomen:soft non tender, non distended Extremities:no edema Neurology: Non focal Skin: no rash   Data Review:    CBC Recent Labs  Lab 01/29/19 1916  01/30/19 0554 01/30/19 1154 01/31/19 0305 02/01/19 0343 02/02/19 0245  WBC 6.8 6.1 5.6 4.6 5.6 6.3  HGB 14.3 12.5 13.4 12.9 13.0 13.1  HCT 42.7 37.9 41.0 39.1 39.8 40.2  PLT 211 217 210 270 360 403*  MCV 89.9 89.6 89.7 88.7 89.0 89.5  MCH 30.1 29.6 29.3 29.3 29.1 29.2  MCHC 33.5 33.0 32.7 33.0 32.7 32.6  RDW 13.2 13.3 13.4 13.2 13.3 13.2  LYMPHSABS 0.8  --   --  1.1 1.3 1.7  MONOABS 0.4  --   --  0.5 0.7 0.8  EOSABS 0.0  --   --  0.0 0.0 0.0  BASOSABS 0.0  --   --  0.0 0.0 0.0    Chemistries  Recent Labs  Lab 01/29/19 1916 01/30/19 0554 01/30/19 1154 01/31/19 0305 02/01/19 0343 02/02/19 0245  NA 137 137  --  137 137 137  K 4.0 3.2*  --  3.8 3.6 3.3*  CL 100 97*  --  97* 97* 96*  CO2 25 25  --  GLUCOSE 136* 134*  --  146* 148* 133*  BUN 7 7  --  CREATININE 0.77 0.74 0.78 0.72 0.72 0.72  CALCIUM 9.0 8.6*  --  8.7* 8.7* 8.5*  MG  --   --   --  2.0 2.2 2.3  AST  --  33  --  38 35 24  ALT  --  20  --  ALKPHOS  --  61  --  61 59 53  BILITOT  --  0.3  --  0.4 0.6 0.5   ------------------------------------------------------------------------------------------------------------------ No results for input(s): CHOL, HDL, LDLCALC, TRIG, CHOLHDL, LDLDIRECT in the last 72 hours.  Lab Results  Component Value Date   HGBA1C 6.1 (H) 02/02/2019   ------------------------------------------------------------------------------------------------------------------ No results for input(s): TSH, T4TOTAL, T3FREE, THYROIDAB in the last 72 hours.  Invalid input(s): FREET3 ------------------------------------------------------------------------------------------------------------------ Recent Labs    02/01/19 0343 02/02/19 0245  FERRITIN 334* 274    Coagulation profile No results for input(s): INR, PROTIME in the last 168 hours.  Recent Labs    02/01/19 0343 02/02/19 0245  DDIMER 0.74* 0.58*    Cardiac Enzymes No results for input(s): CKMB,  TROPONINI, MYOGLOBIN in the last 168 hours.  Invalid input(s): CK ------------------------------------------------------------------------------------------------------------------    Component Value Date/Time   BNP 38.8 01/29/2019 1916    Micro Results Recent Results (from the past 240 hour(s))  SARS CORONAVIRUS 2 (TAT 6-24 HRS) Nasopharyngeal Nasopharyngeal Swab     Status: Abnormal   Collection Time: 01/29/19  8:26 PM   Specimen: Nasopharyngeal Swab  Result Value Ref Range Status   SARS Coronavirus 2 POSITIVE (A) NEGATIVE Final    Comment: RESULT CALLED TO, READ BACK BY AND VERIFIED WITH: T. Doreen Beam 1610 01/30/2019 T. TYSOR (NOTE) SARS-CoV-2 target nucleic acids are DETECTED. The  SARS-CoV-2 RNA is generally detectable in upper and lower respiratory specimens during the acute phase of infection. Positive results are indicative of the presence of SARS-CoV-2 RNA. Clinical correlation with patient history and other diagnostic information is  necessary to determine patient infection status. Positive results do not rule out bacterial infection or co-infection with other viruses.  The expected result is Negative. Fact Sheet for Patients: HairSlick.no Fact Sheet for Healthcare Providers: quierodirigir.com This test is not yet approved or cleared by the [place] FDA and  has been authorized for detection and/or diagnosis of SARS-CoV-2 by FDA under an Emergency Use Authorization (EUA). This EUA will remain  in effect (meaning this test can be used) for t he duration of the COVID-19 declaration under Section 564(b)(1) of the Act, 21 U.S.C. section 360bbb-3(b)(1), unless the authorization is terminated or revoked sooner. Performed at [facility], [address], [place], [place] [address]   Respiratory Panel by PCR     Status: None   Collection Time: 01/30/19  1:24 AM   Specimen: Nasopharyngeal Swab; Respiratory   Result Value Ref Range Status   Adenovirus NOT DETECTED NOT DETECTED Final   Coronavirus 229E NOT DETECTED NOT DETECTED Final    Comment: (NOTE) The Coronavirus on the Respiratory Panel, DOES NOT test for the novel  Coronavirus (2019 nCoV)    Coronavirus HKU1 NOT DETECTED NOT DETECTED Final   Coronavirus NL63 NOT DETECTED NOT DETECTED Final   Coronavirus OC43 NOT DETECTED NOT DETECTED Final   Metapneumovirus NOT DETECTED NOT DETECTED Final   Rhinovirus / Enterovirus NOT DETECTED NOT DETECTED Final   Influenza A NOT DETECTED NOT DETECTED Final   Influenza B NOT DETECTED NOT DETECTED Final   Parainfluenza Virus 1 NOT DETECTED NOT DETECTED Final   Parainfluenza Virus 2 NOT DETECTED NOT DETECTED Final   Parainfluenza Virus 3 NOT DETECTED NOT DETECTED Final   Parainfluenza Virus 4 NOT DETECTED NOT DETECTED Final   Respiratory Syncytial Virus NOT DETECTED NOT DETECTED Final   Bordetella pertussis NOT DETECTED NOT DETECTED Final   Chlamydophila pneumoniae NOT DETECTED NOT DETECTED Final   Mycoplasma pneumoniae NOT DETECTED NOT DETECTED Final    Comment: Performed at [facility], [address] [place], [place] [address]    Radiology Reports CT Angio Chest PE W and/or Wo Contrast  Result Date: 01/29/2019 CLINICAL DATA:  48 year old female with shortness of breath. EXAM: CT ANGIOGRAPHY CHEST WITH CONTRAST TECHNIQUE: Multidetector CT imaging of the chest was performed using the standard protocol during bolus administration of intravenous contrast. Multiplanar CT image reconstructions and MIPs were obtained to evaluate the vascular anatomy. CONTRAST:  OMNIPAQUE IOHEXOL 350 MG/ML SOLN COMPARISON:  Chest radiograph dated 01/29/2019. FINDINGS: Evaluation is limited due to patient's body habitus and streak artifact caused by patient's arms. Cardiovascular: There is mild cardiomegaly. No pericardial effusion. The thoracic aorta is unremarkable. There is mild dilatation of the main  pulmonary trunk suggestive of a degree of pulmonary hypertension. Clinical correlation is recommended. Evaluation for pulmonary embolism is very limited, nondiagnostic due to streak artifact and suboptimal opacification of the pulmonary artery branches. Mediastinum/Nodes: No definite hilar or mediastinal adenopathy. The esophagus and the thyroid gland are grossly unremarkable. No mediastinal fluid collection. Lungs/Pleura: Bilateral upper and lower lobe patchy airspace opacities most consistent with multifocal pneumonia. Clinical correlation and follow-up to resolution recommended. There is no pleural effusion or pneumothorax. The central airways are patent. Upper Abdomen: No acute abnormality. Musculoskeletal: No chest wall abnormality. No acute  or significant osseous findings. Review of the MIP images confirms the above findings. IMPRESSION: 1. Multifocal pneumonia. Clinical correlation and follow-up to resolution recommended. 2. Nondiagnostic evaluation of the pulmonary arteries. 3. Mild cardiomegaly. Electronically Signed   By: Elgie CollardArash  Radparvar M.D.   On: 01/29/2019 23:43   DG CHEST PORT 1 VIEW  Result Date: 01/30/2019 CLINICAL DATA:  Shortness of breath, pneumonia EXAM: PORTABLE CHEST 1 VIEW COMPARISON:  01/29/19 FINDINGS: Low lung volumes. Diffuse bilateral opacities. No significant pleural effusion. No pneumothorax. Stable cardiomediastinal contours. IMPRESSION: Similar appearance of multifocal pneumonia. Electronically Signed   By: Guadlupe SpanishPraneil  Patel M.D.   On: 01/30/2019 16:04   DG Chest Portable 1 View  Result Date: 01/29/2019 CLINICAL DATA:  Cough. EXAM: PORTABLE CHEST 1 VIEW COMPARISON:  January 29, 2019 FINDINGS: The lung volumes are low. The heart size is enlarged. There are worsening hazy bilateral airspace opacities. There is no pneumothorax. No large pleural effusion. IMPRESSION: Worsening bilateral airspace opacities concerning for multifocal pneumonia (viral or bacterial). Electronically Signed    By: Katherine Mantlehristopher  Green M.D.   On: 01/29/2019 20:08

## 2019-02-02 NOTE — Plan of Care (Signed)

## 2019-02-03 LAB — GLUCOSE, CAPILLARY
Glucose-Capillary: 114 mg/dL — ABNORMAL HIGH (ref 70–99)
Glucose-Capillary: 157 mg/dL — ABNORMAL HIGH (ref 70–99)
Glucose-Capillary: 190 mg/dL — ABNORMAL HIGH (ref 70–99)

## 2019-02-03 LAB — CBC WITH DIFFERENTIAL/PLATELET
Abs Immature Granulocytes: 0.04 10*3/uL (ref 0.00–0.07)
Basophils Absolute: 0 10*3/uL (ref 0.0–0.1)
Basophils Relative: 0 %
Eosinophils Absolute: 0 10*3/uL (ref 0.0–0.5)
Eosinophils Relative: 0 %
HCT: 40.3 % (ref 36.0–46.0)
Hemoglobin: 13.2 g/dL (ref 12.0–15.0)
Immature Granulocytes: 1 %
Lymphocytes Relative: 33 %
Lymphs Abs: 2.4 10*3/uL (ref 0.7–4.0)
MCH: 28.9 pg (ref 26.0–34.0)
MCHC: 32.8 g/dL (ref 30.0–36.0)
MCV: 88.4 fL (ref 80.0–100.0)
Monocytes Absolute: 0.8 10*3/uL (ref 0.1–1.0)
Monocytes Relative: 11 %
Neutro Abs: 3.9 10*3/uL (ref 1.7–7.7)
Neutrophils Relative %: 55 %
Platelets: 444 10*3/uL — ABNORMAL HIGH (ref 150–400)
RBC: 4.56 MIL/uL (ref 3.87–5.11)
RDW: 13.1 % (ref 11.5–15.5)
WBC: 7.1 10*3/uL (ref 4.0–10.5)
nRBC: 0 % (ref 0.0–0.2)

## 2019-02-03 LAB — COMPREHENSIVE METABOLIC PANEL
ALT: 22 U/L (ref 0–44)
AST: 25 U/L (ref 15–41)
Albumin: 3.2 g/dL — ABNORMAL LOW (ref 3.5–5.0)
Alkaline Phosphatase: 55 U/L (ref 38–126)
Anion gap: 14 (ref 5–15)
BUN: 13 mg/dL (ref 6–20)
CO2: 26 mmol/L (ref 22–32)
Calcium: 8.6 mg/dL — ABNORMAL LOW (ref 8.9–10.3)
Chloride: 100 mmol/L (ref 98–111)
Creatinine, Ser: 0.72 mg/dL (ref 0.44–1.00)
GFR calc Af Amer: >60 mL/min (ref >60)
GFR calc non Af Amer: >60 mL/min (ref >60)
Glucose, Bld: 122 mg/dL — ABNORMAL HIGH (ref 70–99)
Potassium: 4 mmol/L (ref 3.5–5.1)
Sodium: 140 mmol/L (ref 135–145)
Total Bilirubin: 0.5 mg/dL (ref 0.3–1.2)
Total Protein: 7.8 g/dL (ref 6.5–8.1)

## 2019-02-03 LAB — C-REACTIVE PROTEIN: CRP: 3.6 mg/dL — ABNORMAL HIGH (ref <1.0)

## 2019-02-03 LAB — FERRITIN: Ferritin: 254 ng/mL (ref 11–307)

## 2019-02-03 LAB — D-DIMER, QUANTITATIVE: D-Dimer, Quant: 0.54 ug/mL-FEU — ABNORMAL HIGH (ref 0.00–0.50)

## 2019-02-03 MED ORDER — DEXAMETHASONE 6 MG PO TABS: 6.0000 mg | ORAL_TABLET | Freq: Every day | ORAL | 0 refills | Status: DC

## 2019-02-03 MED ORDER — BENZONATATE 100 MG PO CAPS: 100.0000 mg | ORAL_CAPSULE | Freq: Three times a day (TID) | ORAL | 0 refills | Status: AC | PRN

## 2019-02-03 NOTE — Discharge Instructions (Signed)
Person Under Monitoring Name: Judith Aguirre  Location: [place] [address]   Infection Prevention Recommendations for Individuals Confirmed to have, or Being Evaluated for, 2019 Novel Coronavirus (COVID-19) Infection Who Receive Care at Home  Individuals who are confirmed to have, or are being evaluated for, COVID-19 should follow the prevention steps below until a healthcare provider or local or state health department says they can return to normal activities.  Stay home except to get medical care You should restrict activities outside your home, except for getting medical care. Do not go to work, school, or public areas, and do not use public transportation or taxis.  Call ahead before visiting your doctor Before your medical appointment, call the healthcare provider and tell them that you have, or are being evaluated for, COVID-19 infection. This will help the healthcare providers office take steps to keep other people from getting infected. Ask your healthcare provider to call the local or state health department.  Monitor your symptoms Seek prompt medical attention if your illness is worsening (e.g., difficulty breathing). Before going to your medical appointment, call the healthcare provider and tell them that you have, or are being evaluated for, COVID-19 infection. Ask your healthcare provider to call the local or state health department.  Wear a facemask You should wear a facemask that covers your nose and mouth when you are in the same room with other people and when you visit a healthcare provider. People who live with or visit you should also wear a facemask while they are in the same room with you.  Separate yourself from other people in your home As much as possible, you should stay in a different room from other people in your home. Also, you should use a separate bathroom, if available.  Avoid sharing household items You should not share  dishes, drinking glasses, cups, eating utensils, towels, bedding, or other items with other people in your home. After using these items, you should wash them thoroughly with soap and water.  Cover your coughs and sneezes Cover your mouth and nose with a tissue when you cough or sneeze, or you can cough or sneeze into your sleeve. Throw used tissues in a lined trash can, and immediately wash your hands with soap and water for at least 20 seconds or use an alcohol-based hand rub.  Wash your [facility] your hands often and thoroughly with soap and water for at least 20 seconds. You can use an alcohol-based hand sanitizer if soap and water are not available and if your hands are not visibly dirty. Avoid touching your eyes, nose, and mouth with unwashed hands.   Prevention Steps for Caregivers and Household Members of Individuals Confirmed to have, or Being Evaluated for, COVID-19 Infection Being Cared for in the Home  If you live with, or provide care at home for, a person confirmed to have, or being evaluated for, COVID-19 infection please follow these guidelines to prevent infection:  Follow healthcare providers instructions Make sure that you understand and can help the patient follow any healthcare provider instructions for all care.  Provide for the patients basic needs You should help the patient with basic needs in the home and provide support for getting groceries, prescriptions, and other personal needs.  Monitor the patients symptoms If they are getting sicker, call his or her medical provider and tell them that the patient has, or is being evaluated for, COVID-19 infection. This will help the healthcare providers  office take steps to keep other people from getting infected. Ask the healthcare provider to call the local or state health department.  Limit the number of people who have contact with the patient  If possible, have only one caregiver for the patient.  Other  household members should stay in another home or place of residence. If this is not possible, they should stay  in another room, or be separated from the patient as much as possible. Use a separate bathroom, if available.  Restrict visitors who do not have an essential need to be in the home.  Keep older adults, very young children, and other sick people away from the patient Keep older adults, very young children, and those who have compromised immune systems or chronic health conditions away from the patient. This includes people with chronic heart, lung, or kidney conditions, diabetes, and cancer.  Ensure good ventilation Make sure that shared spaces in the home have good air flow, such as from an air conditioner or an opened window, weather permitting.  Wash your hands often  Wash your hands often and thoroughly with soap and water for at least 20 seconds. You can use an alcohol based hand sanitizer if soap and water are not available and if your hands are not visibly dirty.  Avoid touching your eyes, nose, and mouth with unwashed hands.  Use disposable paper towels to dry your hands. If not available, use dedicated cloth towels and replace them when they become wet.  Wear a facemask and gloves  Wear a disposable facemask at all times in the room and gloves when you touch or have contact with the patients blood, body fluids, and/or secretions or excretions, such as sweat, saliva, sputum, nasal mucus, vomit, urine, or feces.  Ensure the mask fits over your nose and mouth tightly, and do not touch it during use.  Throw out disposable facemasks and gloves after using them. Do not reuse.  Wash your hands immediately after removing your facemask and gloves.  If your personal clothing becomes contaminated, carefully remove clothing and launder. Wash your hands after handling contaminated clothing.  Place all used disposable facemasks, gloves, and other waste in a lined container before  disposing them with other household waste.  Remove gloves and wash your hands immediately after handling these items.  Do not share dishes, glasses, or other household items with the patient  Avoid sharing household items. You should not share dishes, drinking glasses, cups, eating utensils, towels, bedding, or other items with a patient who is confirmed to have, or being evaluated for, COVID-19 infection.  After the person uses these items, you should wash them thoroughly with soap and water.  Wash laundry thoroughly  Immediately remove and wash clothes or bedding that have blood, body fluids, and/or secretions or excretions, such as sweat, saliva, sputum, nasal mucus, vomit, urine, or feces, on them.  Wear gloves when handling laundry from the patient.  Read and follow directions on labels of laundry or clothing items and detergent. In general, wash and dry with the warmest temperatures recommended on the label.  Clean all areas the individual has used often  Clean all touchable surfaces, such as counters, tabletops, doorknobs, bathroom fixtures, toilets, phones, keyboards, tablets, and bedside tables, every day. Also, clean any surfaces that may have blood, body fluids, and/or secretions or excretions on them.  Wear gloves when cleaning surfaces the patient has come in contact with.  Use a diluted bleach solution (e.g., dilute bleach with 1  part bleach and 10 parts water) or a household disinfectant with a label that says EPA-registered for coronaviruses. To make a bleach solution at home, add 1 tablespoon of bleach to 1 quart (4 cups) of water. For a larger supply, add  cup of bleach to 1 gallon (16 cups) of water.  Read labels of cleaning products and follow recommendations provided on product labels. Labels contain instructions for safe and effective use of the cleaning product including precautions you should take when applying the product, such as wearing gloves or eye protection  and making sure you have good ventilation during use of the product.  Remove gloves and wash hands immediately after cleaning.  Monitor yourself for signs and symptoms of illness Caregivers and household members are considered close contacts, should monitor their health, and will be asked to limit movement outside of the home to the extent possible. Follow the monitoring steps for close contacts listed on the symptom monitoring form.   ? If you have additional questions, contact your local health department or call the epidemiologist on call at [phone] (available 24/7). ? This guidance is subject to change. For the most up-to-date guidance from [facility], please refer to their website: YouBlogs.pl

## 2019-02-03 NOTE — Progress Notes (Signed)
Patient discharged today. Reviewed AVS Discharge Instructions with patient. Educated Patient on COVID-19 quarantine restriction. Patient verbalized understanding and stated that she would quarantine at home until 02/16/19 which is 3 weeks from 01/29/19 as per instructions. Patient verbalized understanding of all discharge instructions. Patient given copy of AVS instructions. Last VSS.Removed IV access, held pressure for 3 minutes. Pt left in private vehicle with husband.

## 2019-02-03 NOTE — Discharge Summary (Signed)
PATIENT DETAILS Name: Judith Aguirre Age: 48 y.o. Sex: female Date of Birth: 08/20/1970 MRN: [id number]. Admitting Physician: Murlean Iba, MD [id number], Wynonia Musty, MD  Admit Date: 01/29/2019 Discharge date: 02/03/2019  Recommendations for Outpatient Follow-up:  1. Follow up with PCP in 1-2 weeks 2. Please obtain CMP/CBC in one week 3. Repeat Chest Xray in 4-6 week  Admitted From:  Home  Disposition: [place]: No  Equipment/Devices: None  Discharge Condition: Stable  CODE STATUS: FULL CODE  Diet recommendation:  Diet Order            Diet - low sodium heart healthy        Diet Heart Room service appropriate? Yes; Fluid consistency: Thin  Diet effective now               Brief Summary: See H&P, Labs, Consult and Test reports for all details in brief, Patient is a 48 y.o. female with PMHx of HTN, GERD, morbid obesity-who presented with 1 week history of shortness of breath-was found to have acute hypoxic respiratory failure in the setting of COVID-19 pneumonia.  Brief Hospital Course: Acute Hypoxic Resp Failure due to Covid 19 Viral pneumonia:  Much improved-treated with steroids and remdesivir.  Inflammatory markers have trended down.  She is now on room air.  Will finish remdesivir on 12/11, following which she will be discharged home on tapering steroids.  COVID-19 Labs:  Recent Labs    02/01/19 0343 02/02/19 0245 02/03/19 0445  DDIMER 0.74* 0.58* 0.54*  FERRITIN 334* 274 254  LDH 342* 287*  --   CRP 12.2* 6.8* 3.6*    Lab Results  Component Value Date   SARSCOV2NAA POSITIVE (A) 01/29/2019     COVID-19 Medications: Steroids:12/7>> Remdesivir:12/7>>  HTN: Controlled-resume usual antihypertensives on discharge.  GERD: Continue PPI    Obesity: Estimated body mass index is 56.58 kg/m as calculated from the following:   Height as of this encounter: 5\' 11"  (1.803 m).   Weight as of this encounter: 184 kg.     Procedures/Studies: None  Discharge Diagnoses:  Principal Problem:   Acute respiratory failure with hypoxemia (HCC) Active Problems:   Hypertension   Morbid obesity due to excess calories (HCC)   GERD (gastroesophageal reflux disease)   Multifocal pneumonia   COVID-19   Discharge Instructions:    Person Under Monitoring Name: Judith Aguirre  Location: [address] [place] [address]   Infection Prevention Recommendations for Individuals Confirmed to have, or Being Evaluated for, 2019 Novel Coronavirus (COVID-19) Infection Who Receive Care at Home  Individuals who are confirmed to have, or are being evaluated for, COVID-19 should follow the prevention steps below until a healthcare provider or local or state health department says they can return to normal activities.  Stay home except to get medical care You should restrict activities outside your home, except for getting medical care. Do not go to work, school, or public areas, and do not use public transportation or taxis.  Call ahead before visiting your doctor Before your medical appointment, call the healthcare provider and tell them that you have, or are being evaluated for, COVID-19 infection. This will help the healthcare provider's office take steps to keep other people from getting infected. Ask your healthcare provider to call the local or state health department.  Monitor your symptoms Seek prompt medical attention if your illness is worsening (e.g., difficulty breathing). Before going to your medical appointment, call the healthcare provider  and tell them that you have, or are being evaluated for, COVID-19 infection. Ask your healthcare provider to call the local or state health department.  Wear a facemask You should wear a facemask that covers your nose and mouth when you are in the same room with other people and when you visit a healthcare provider. People who live with or visit you should also  wear a facemask while they are in the same room with you.  Separate yourself from other people in your home As much as possible, you should stay in a different room from other people in your home. Also, you should use a separate bathroom, if available.  Avoid sharing household items You should not share dishes, drinking glasses, cups, eating utensils, towels, bedding, or other items with other people in your home. After using these items, you should wash them thoroughly with soap and water.  Cover your coughs and sneezes Cover your mouth and nose with a tissue when you cough or sneeze, or you can cough or sneeze into your sleeve. Throw used tissues in a lined trash can, and immediately wash your hands with soap and water for at least 20 seconds or use an alcohol-based hand rub.  Wash your [employer] your hands often and thoroughly with soap and water for at least 20 seconds. You can use an alcohol-based hand sanitizer if soap and water are not available and if your hands are not visibly dirty. Avoid touching your eyes, nose, and mouth with unwashed hands.   Prevention Steps for Caregivers and Household Members of Individuals Confirmed to have, or Being Evaluated for, COVID-19 Infection Being Cared for in the Home  If you live with, or provide care at home for, a person confirmed to have, or being evaluated for, COVID-19 infection please follow these guidelines to prevent infection:  Follow healthcare provider's instructions Make sure that you understand and can help the patient follow any healthcare provider instructions for all care.  Provide for the patient's basic needs You should help the patient with basic needs in the home and provide support for getting groceries, prescriptions, and other personal needs.  Monitor the patient's symptoms If they are getting sicker, call his or her medical provider and tell them that the patient has, or is being evaluated for, COVID-19  infection. This will help the healthcare provider's office take steps to keep other people from getting infected. Ask the healthcare provider to call the local or state health department.  Limit the number of people who have contact with the patient  If possible, have only one caregiver for the patient.  Other household members should stay in another home or place of residence. If this is not possible, they should stay  in another room, or be separated from the patient as much as possible. Use a separate bathroom, if available.  Restrict visitors who do not have an essential need to be in the home.  Keep older adults, very young children, and other sick people away from the patient Keep older adults, very young children, and those who have compromised immune systems or chronic health conditions away from the patient. This includes people with chronic heart, lung, or kidney conditions, diabetes, and cancer.  Ensure good ventilation Make sure that shared spaces in the home have good air flow, such as from an air conditioner or an opened window, weather permitting.  Wash your hands often  Wash your hands often and thoroughly with soap and water for at least  20 seconds. You can use an alcohol based hand sanitizer if soap and water are not available and if your hands are not visibly dirty.  Avoid touching your eyes, nose, and mouth with unwashed hands.  Use disposable paper towels to dry your hands. If not available, use dedicated cloth towels and replace them when they become wet.  Wear a facemask and gloves  Wear a disposable facemask at all times in the room and gloves when you touch or have contact with the patient's blood, body fluids, and/or secretions or excretions, such as sweat, saliva, sputum, nasal mucus, vomit, urine, or feces.  Ensure the mask fits over your nose and mouth tightly, and do not touch it during use.  Throw out disposable facemasks and gloves after using them. Do  not reuse.  Wash your hands immediately after removing your facemask and gloves.  If your personal clothing becomes contaminated, carefully remove clothing and launder. Wash your hands after handling contaminated clothing.  Place all used disposable facemasks, gloves, and other waste in a lined container before disposing them with other household waste.  Remove gloves and wash your hands immediately after handling these items.  Do not share dishes, glasses, or other household items with the patient  Avoid sharing household items. You should not share dishes, drinking glasses, cups, eating utensils, towels, bedding, or other items with a patient who is confirmed to have, or being evaluated for, COVID-19 infection.  After the person uses these items, you should wash them thoroughly with soap and water.  Wash laundry thoroughly  Immediately remove and wash clothes or bedding that have blood, body fluids, and/or secretions or excretions, such as sweat, saliva, sputum, nasal mucus, vomit, urine, or feces, on them.  Wear gloves when handling laundry from the patient.  Read and follow directions on labels of laundry or clothing items and detergent. In general, wash and dry with the warmest temperatures recommended on the label.  Clean all areas the individual has used often  Clean all touchable surfaces, such as counters, tabletops, doorknobs, bathroom fixtures, toilets, phones, keyboards, tablets, and bedside tables, every day. Also, clean any surfaces that may have blood, body fluids, and/or secretions or excretions on them.  Wear gloves when cleaning surfaces the patient has come in contact with.  Use a diluted bleach solution (e.g., dilute bleach with 1 part bleach and 10 parts water) or a household disinfectant with a label that says EPA-registered for coronaviruses. To make a bleach solution at home, add 1 tablespoon of bleach to 1 quart (4 cups) of water. For a larger supply, add  cup  of bleach to 1 gallon (16 cups) of water.  Read labels of cleaning products and follow recommendations provided on product labels. Labels contain instructions for safe and effective use of the cleaning product including precautions you should take when applying the product, such as wearing gloves or eye protection and making sure you have good ventilation during use of the product.  Remove gloves and wash hands immediately after cleaning.  Monitor yourself for signs and symptoms of illness Caregivers and household members are considered close contacts, should monitor their health, and will be asked to limit movement outside of the home to the extent possible. Follow the monitoring steps for close contacts listed on the symptom monitoring form.   ? If you have additional questions, contact your local health department or call the epidemiologist on call at [phone] (available 24/7). ? This guidance is subject to change. For the  most up-to-date guidance from CDC, please refer to their website: TripMetro.hu    Activity:  As tolerated  Discharge Instructions    Call MD for:  difficulty breathing, headache or visual disturbances   Complete by: As directed    Call MD for:  extreme fatigue   Complete by: As directed    Call MD for:  severe uncontrolled pain   Complete by: As directed    Diet - low sodium heart healthy   Complete by: As directed    Discharge instructions   Complete by: As directed    Follow with Primary MD  Angelica Chessman, MD in 1-2 weeks  Please get a complete blood count and chemistry panel checked by your Primary MD at your next visit, and again as instructed by your Primary MD.  Get Medicines reviewed and adjusted: Please take all your medications with you for your next visit with your Primary MD  Laboratory/radiological data: Please request your Primary MD to go over all hospital tests and  procedure/radiological results at the follow up, please ask your Primary MD to get all Hospital records sent to his/her office.  In some cases, they will be blood work, cultures and biopsy results pending at the time of your discharge. Please request that your primary care M.D. follows up on these results.  Also Note the following: If you experience worsening of your admission symptoms, develop shortness of breath, life threatening emergency, suicidal or homicidal thoughts you must seek medical attention immediately by calling 911 or calling your MD immediately  if symptoms less severe.  You must read complete instructions/literature along with all the possible adverse reactions/side effects for all the Medicines you take and that have been prescribed to you. Take any new Medicines after you have completely understood and accpet all the possible adverse reactions/side effects.   Do not drive when taking Pain medications or sleeping medications (Benzodaizepines)  Do not take more than prescribed Pain, Sleep and Anxiety Medications. It is not advisable to combine anxiety,sleep and pain medications without talking with your primary care practitioner  Special Instructions: If you have smoked or chewed Tobacco  in the last 2 yrs please stop smoking, stop any regular Alcohol  and or any Recreational drug use.  Wear Seat belts while driving.  Please note: You were cared for by a hospitalist during your hospital stay. Once you are discharged, your primary care physician will handle any further medical issues. Please note that NO REFILLS for any discharge medications will be authorized once you are discharged, as it is imperative that you return to your primary care physician (or establish a relationship with a primary care physician if you do not have one) for your post hospital discharge needs so that they can reassess your need for medications and monitor your lab values.   3 weeks of isolation from  01/29/2019   Increase activity slowly   Complete by: As directed       Follow-up Information    Angelica Chessman, MD. Schedule an appointment as soon as possible for a visit in 1 week(s).   Specialty: Family Medicine Contact information: [address] [place] [address] [phone]          Allergies  Allergen Reactions  . Lisinopril Shortness Of Breath and Other (See Comments)    Cough     Consultations:   None   Other Procedures/Studies: CT Angio Chest PE W and/or Wo Contrast  Result Date: 01/29/2019  CLINICAL DATA:  48 year old female with shortness of breath. EXAM: CT ANGIOGRAPHY CHEST WITH CONTRAST TECHNIQUE: Multidetector CT imaging of the chest was performed using the standard protocol during bolus administration of intravenous contrast. Multiplanar CT image reconstructions and MIPs were obtained to evaluate the vascular anatomy. CONTRAST:  100mL OMNIPAQUE IOHEXOL 350 MG/ML SOLN COMPARISON:  Chest radiograph dated 01/29/2019. FINDINGS: Evaluation is limited due to patient's body habitus and streak artifact caused by patient's arms. Cardiovascular: There is mild cardiomegaly. No pericardial effusion. The thoracic aorta is unremarkable. There is mild dilatation of the main pulmonary trunk suggestive of a degree of pulmonary hypertension. Clinical correlation is recommended. Evaluation for pulmonary embolism is very limited, nondiagnostic due to streak artifact and suboptimal opacification of the pulmonary artery branches. Mediastinum/Nodes: No definite hilar or mediastinal adenopathy. The esophagus and the thyroid gland are grossly unremarkable. No mediastinal fluid collection. Lungs/Pleura: Bilateral upper and lower lobe patchy airspace opacities most consistent with multifocal pneumonia. Clinical correlation and follow-up to resolution recommended. There is no pleural effusion or pneumothorax. The central airways are patent. Upper Abdomen: No acute abnormality.  Musculoskeletal: No chest wall abnormality. No acute or significant osseous findings. Review of the MIP images confirms the above findings. IMPRESSION: 1. Multifocal pneumonia. Clinical correlation and follow-up to resolution recommended. 2. Nondiagnostic evaluation of the pulmonary arteries. 3. Mild cardiomegaly. Electronically Signed   By: Elgie CollardArash  Radparvar M.D.   On: 01/29/2019 23:43   DG CHEST PORT 1 VIEW  Result Date: 01/30/2019 CLINICAL DATA:  Shortness of breath, pneumonia EXAM: PORTABLE CHEST 1 VIEW COMPARISON:  01/29/19 FINDINGS: Low lung volumes. Diffuse bilateral opacities. No significant pleural effusion. No pneumothorax. Stable cardiomediastinal contours. IMPRESSION: Similar appearance of multifocal pneumonia. Electronically Signed   By: Guadlupe SpanishPraneil  Patel M.D.   On: 01/30/2019 16:04   DG Chest Portable 1 View  Result Date: 01/29/2019 CLINICAL DATA:  Cough. EXAM: PORTABLE CHEST 1 VIEW COMPARISON:  January 29, 2019 FINDINGS: The lung volumes are low. The heart size is enlarged. There are worsening hazy bilateral airspace opacities. There is no pneumothorax. No large pleural effusion. IMPRESSION: Worsening bilateral airspace opacities concerning for multifocal pneumonia (viral or bacterial). Electronically Signed   By: Katherine Mantlehristopher  Green M.D.   On: 01/29/2019 20:08     TODAY-DAY OF DISCHARGE:  Subjective:   Judith Aguirre today has no headache,no chest abdominal pain,no new weakness tingling or numbness, feels much better wants to go home today.   Objective:   Blood pressure (!) 136/91, pulse 73, temperature 98 F (36.7 C), resp. rate 18, height 5\' 11"  (1.803 m), weight (!) 184 kg, last menstrual period 01/03/2019, SpO2 97 %.  Intake/Output Summary (Last 24 hours) at 02/03/2019 0945 Last data filed at 02/02/2019 1140 Gross per 24 hour  Intake 240 ml  Output --  Net 240 ml   Filed Weights   02/01/19 0500 02/02/19 0434 02/03/19 0500  Weight: (!) 181.6 kg (!) 186.7 kg (!) 184 kg     Exam: Awake Alert, Oriented *3, No new F.N deficits, Normal affect [place].AT,PERRAL Supple Neck,No JVD, No cervical lymphadenopathy appriciated.  Symmetrical Chest wall movement, Good air movement bilaterally, CTAB RRR,No Gallops,Rubs or new Murmurs, No Parasternal Heave +ve B.Sounds, Abd Soft, Non tender, No organomegaly appriciated, No rebound -guarding or rigidity. No Cyanosis, Clubbing or edema, No new Rash or bruise   PERTINENT RADIOLOGIC STUDIES: CT Angio Chest PE W and/or Wo Contrast  Result Date: 01/29/2019 CLINICAL DATA:  48 year old female with shortness of breath. EXAM: CT ANGIOGRAPHY CHEST WITH  CONTRAST TECHNIQUE: Multidetector CT imaging of the chest was performed using the standard protocol during bolus administration of intravenous contrast. Multiplanar CT image reconstructions and MIPs were obtained to evaluate the vascular anatomy. CONTRAST:  OMNIPAQUE IOHEXOL 350 MG/ML SOLN COMPARISON:  Chest radiograph dated 01/29/2019. FINDINGS: Evaluation is limited due to patient's body habitus and streak artifact caused by patient's arms. Cardiovascular: There is mild cardiomegaly. No pericardial effusion. The thoracic aorta is unremarkable. There is mild dilatation of the main pulmonary trunk suggestive of a degree of pulmonary hypertension. Clinical correlation is recommended. Evaluation for pulmonary embolism is very limited, nondiagnostic due to streak artifact and suboptimal opacification of the pulmonary artery branches. Mediastinum/Nodes: No definite hilar or mediastinal adenopathy. The esophagus and the thyroid gland are grossly unremarkable. No mediastinal fluid collection. Lungs/Pleura: Bilateral upper and lower lobe patchy airspace opacities most consistent with multifocal pneumonia. Clinical correlation and follow-up to resolution recommended. There is no pleural effusion or pneumothorax. The central airways are patent. Upper Abdomen: No acute abnormality. Musculoskeletal: No  chest wall abnormality. No acute or significant osseous findings. Review of the MIP images confirms the above findings. IMPRESSION: 1. Multifocal pneumonia. Clinical correlation and follow-up to resolution recommended. 2. Nondiagnostic evaluation of the pulmonary arteries. 3. Mild cardiomegaly. Electronically Signed   By: Elgie Collard M.D.   On: 01/29/2019 23:43   DG CHEST PORT 1 VIEW  Result Date: 01/30/2019 CLINICAL DATA:  Shortness of breath, pneumonia EXAM: PORTABLE CHEST 1 VIEW COMPARISON:  01/29/19 FINDINGS: Low lung volumes. Diffuse bilateral opacities. No significant pleural effusion. No pneumothorax. Stable cardiomediastinal contours. IMPRESSION: Similar appearance of multifocal pneumonia. Electronically Signed   By: Guadlupe Spanish M.D.   On: 01/30/2019 16:04   DG Chest Portable 1 View  Result Date: 01/29/2019 CLINICAL DATA:  Cough. EXAM: PORTABLE CHEST 1 VIEW COMPARISON:  January 29, 2019 FINDINGS: The lung volumes are low. The heart size is enlarged. There are worsening hazy bilateral airspace opacities. There is no pneumothorax. No large pleural effusion. IMPRESSION: Worsening bilateral airspace opacities concerning for multifocal pneumonia (viral or bacterial). Electronically Signed   By: Katherine Mantle M.D.   On: 01/29/2019 20:08     PERTINENT LAB RESULTS: CBC: Recent Labs    02/02/19 0245 02/03/19 0445  WBC 6.3 7.1  HGB 13.1 13.2  HCT 40.2 40.3  PLT 403* 444*   CMET CMP     Component Value Date/Time   NA 140 02/03/2019 0445   K 4.0 02/03/2019 0445   CL 100 02/03/2019 0445   CO2 26 02/03/2019 0445   GLUCOSE 122 (H) 02/03/2019 0445   BUN 13 02/03/2019 0445   CREATININE 0.72 02/03/2019 0445   CALCIUM 8.6 (L) 02/03/2019 0445   PROT 7.8 02/03/2019 0445   ALBUMIN 3.2 (L) 02/03/2019 0445   AST 25 02/03/2019 0445   ALT 22 02/03/2019 0445   ALKPHOS 55 02/03/2019 0445   BILITOT 0.5 02/03/2019 0445   GFRNONAA >60 02/03/2019 0445   GFRAA >60 02/03/2019 0445     GFR Estimated Creatinine Clearance: 157.6 mL/min (by C-G formula based on SCr of 0.72 mg/dL). No results for input(s): LIPASE, AMYLASE in the last 72 hours. No results for input(s): CKTOTAL, CKMB, CKMBINDEX, TROPONINI in the last 72 hours. Invalid input(s): POCBNP Recent Labs    02/02/19 0245 02/03/19 0445  DDIMER 0.58* 0.54*   Recent Labs    02/02/19 0245  HGBA1C 6.1*   No results for input(s): CHOL, HDL, LDLCALC, TRIG, CHOLHDL, LDLDIRECT in the last 72 hours.  No results for input(s): TSH, T4TOTAL, T3FREE, THYROIDAB in the last 72 hours.  Invalid input(s): FREET3 Recent Labs    02/02/19 0245 02/03/19 0445  FERRITIN 274 254   Coags: No results for input(s): INR in the last 72 hours.  Invalid input(s): PT Microbiology: Recent Results (from the past 240 hour(s))  SARS CORONAVIRUS 2 (TAT 6-24 HRS) Nasopharyngeal Nasopharyngeal Swab     Status: Abnormal   Collection Time: 01/29/19  8:26 PM   Specimen: Nasopharyngeal Swab  Result Value Ref Range Status   SARS Coronavirus 2 POSITIVE (A) NEGATIVE Final    Comment: RESULT CALLED TO, READ BACK BY AND VERIFIED WITH: T. Doreen Beam [id number] 01/30/2019 T. TYSOR (NOTE) SARS-CoV-2 target nucleic acids are DETECTED. The SARS-CoV-2 RNA is generally detectable in upper and lower respiratory specimens during the acute phase of infection. Positive results are indicative of the presence of SARS-CoV-2 RNA. Clinical correlation with patient history and other diagnostic information is  necessary to determine patient infection status. Positive results do not rule out bacterial infection or co-infection with other viruses.  The expected result is Negative. Fact Sheet for Patients: HairSlick.no Fact Sheet for Healthcare Providers: quierodirigir.com This test is not yet approved or cleared by the [place] FDA and  has been authorized for detection and/or diagnosis of SARS-CoV-2  by FDA under an Emergency Use Authorization (EUA). This EUA will remain  in effect (meaning this test can be used) for t he duration of the COVID-19 declaration under Section 564(b)(1) of the Act, 21 U.S.C. section 360bbb-3(b)(1), unless the authorization is terminated or revoked sooner. Performed at [facility], [address], [place], [place] [address]   Respiratory Panel by PCR     Status: None   Collection Time: 01/30/19  1:24 AM   Specimen: Nasopharyngeal Swab; Respiratory  Result Value Ref Range Status   Adenovirus NOT DETECTED NOT DETECTED Final   Coronavirus 229E NOT DETECTED NOT DETECTED Final    Comment: (NOTE) The Coronavirus on the Respiratory Panel, DOES NOT test for the novel  Coronavirus (2019 nCoV)    Coronavirus HKU1 NOT DETECTED NOT DETECTED Final   Coronavirus NL63 NOT DETECTED NOT DETECTED Final   Coronavirus OC43 NOT DETECTED NOT DETECTED Final   Metapneumovirus NOT DETECTED NOT DETECTED Final   Rhinovirus / Enterovirus NOT DETECTED NOT DETECTED Final   Influenza A NOT DETECTED NOT DETECTED Final   Influenza B NOT DETECTED NOT DETECTED Final   Parainfluenza Virus 1 NOT DETECTED NOT DETECTED Final   Parainfluenza Virus 2 NOT DETECTED NOT DETECTED Final   Parainfluenza Virus 3 NOT DETECTED NOT DETECTED Final   Parainfluenza Virus 4 NOT DETECTED NOT DETECTED Final   Respiratory Syncytial Virus NOT DETECTED NOT DETECTED Final   Bordetella pertussis NOT DETECTED NOT DETECTED Final   Chlamydophila pneumoniae NOT DETECTED NOT DETECTED Final   Mycoplasma pneumoniae NOT DETECTED NOT DETECTED Final    Comment: Performed at [facility], [address], [place], [place] [address]    FURTHER DISCHARGE INSTRUCTIONS:  Get Medicines reviewed and adjusted: Please take all your medications with you for your next visit with your Primary MD  Laboratory/radiological data: Please request your Primary MD to go over all hospital tests and procedure/radiological  results at the follow up, please ask your Primary MD to get all Hospital records sent to his/her office.  In some cases, they will be blood work, cultures and biopsy results pending at the time of your discharge. Please request that your  primary care M.D. goes through all the records of your hospital data and follows up on these results.  Also Note the following: If you experience worsening of your admission symptoms, develop shortness of breath, life threatening emergency, suicidal or homicidal thoughts you must seek medical attention immediately by calling 911 or calling your MD immediately  if symptoms less severe.  You must read complete instructions/literature along with all the possible adverse reactions/side effects for all the Medicines you take and that have been prescribed to you. Take any new Medicines after you have completely understood and accpet all the possible adverse reactions/side effects.   Do not drive when taking Pain medications or sleeping medications (Benzodaizepines)  Do not take more than prescribed Pain, Sleep and Anxiety Medications. It is not advisable to combine anxiety,sleep and pain medications without talking with your primary care practitioner  Special Instructions: If you have smoked or chewed Tobacco  in the last 2 yrs please stop smoking, stop any regular Alcohol  and or any Recreational drug use.  Wear Seat belts while driving.  Please note: You were cared for by a hospitalist during your hospital stay. Once you are discharged, your primary care physician will handle any further medical issues. Please note that NO REFILLS for any discharge medications will be authorized once you are discharged, as it is imperative that you return to your primary care physician (or establish a relationship with a primary care physician if you do not have one) for your post hospital discharge needs so that they can reassess your need for medications and monitor your lab  values.  Total Time spent coordinating discharge including counseling, education and face to face time equals35 minutes.  SignedJeoffrey Massed 02/03/2019 9:45 AM

## 2020-04-08 ENCOUNTER — Encounter (HOSPITAL_BASED_OUTPATIENT_CLINIC_OR_DEPARTMENT_OTHER): Payer: Self-pay

## 2020-04-08 ENCOUNTER — Emergency Department (HOSPITAL_BASED_OUTPATIENT_CLINIC_OR_DEPARTMENT_OTHER): Payer: PRIVATE HEALTH INSURANCE

## 2020-04-08 ENCOUNTER — Other Ambulatory Visit: Payer: Self-pay

## 2020-04-08 ENCOUNTER — Observation Stay (HOSPITAL_BASED_OUTPATIENT_CLINIC_OR_DEPARTMENT_OTHER)
Admission: EM | Admit: 2020-04-08 | Discharge: 2020-04-09 | Disposition: A | Discharge status: Home / Self Care | Payer: PRIVATE HEALTH INSURANCE | Attending: Cardiovascular Disease | Admitting: Cardiovascular Disease

## 2020-04-08 DIAGNOSIS — K219 Gastro-esophageal reflux disease without esophagitis: Secondary | ICD-10-CM | POA: Diagnosis not present

## 2020-04-08 DIAGNOSIS — R0789 Other chest pain: Secondary | ICD-10-CM | POA: Insufficient documentation

## 2020-04-08 DIAGNOSIS — Z8616 Personal history of COVID-19: Secondary | ICD-10-CM | POA: Diagnosis not present

## 2020-04-08 DIAGNOSIS — I4891 Unspecified atrial fibrillation: Secondary | ICD-10-CM | POA: Diagnosis not present

## 2020-04-08 DIAGNOSIS — Z20822 Contact with and (suspected) exposure to covid-19: Secondary | ICD-10-CM | POA: Diagnosis not present

## 2020-04-08 DIAGNOSIS — R002 Palpitations: Secondary | ICD-10-CM

## 2020-04-08 DIAGNOSIS — N3000 Acute cystitis without hematuria: Secondary | ICD-10-CM

## 2020-04-08 DIAGNOSIS — Z79899 Other long term (current) drug therapy: Secondary | ICD-10-CM | POA: Insufficient documentation

## 2020-04-08 DIAGNOSIS — I10 Essential (primary) hypertension: Secondary | ICD-10-CM | POA: Diagnosis not present

## 2020-04-08 LAB — BASIC METABOLIC PANEL
Anion gap: 10 (ref 5–15)
BUN: 12 mg/dL (ref 6–20)
CO2: 26 mmol/L (ref 22–32)
Calcium: 9 mg/dL (ref 8.9–10.3)
Chloride: 101 mmol/L (ref 98–111)
Creatinine, Ser: 0.98 mg/dL (ref 0.44–1.00)
GFR, Estimated: >60 mL/min (ref >60)
Glucose, Bld: 123 mg/dL — ABNORMAL HIGH (ref 70–99)
Potassium: 3.3 mmol/L — ABNORMAL LOW (ref 3.5–5.1)
Sodium: 137 mmol/L (ref 135–145)

## 2020-04-08 LAB — CBC
HCT: 39.8 % (ref 36.0–46.0)
Hemoglobin: 13.4 g/dL (ref 12.0–15.0)
MCH: 29.8 pg (ref 26.0–34.0)
MCHC: 33.7 g/dL (ref 30.0–36.0)
MCV: 88.6 fL (ref 80.0–100.0)
Platelets: 326 10*3/uL (ref 150–400)
RBC: 4.49 MIL/uL (ref 3.87–5.11)
RDW: 13 % (ref 11.5–15.5)
WBC: 8.9 10*3/uL (ref 4.0–10.5)
nRBC: 0 % (ref 0.0–0.2)

## 2020-04-08 LAB — URINALYSIS, MICROSCOPIC (REFLEX)

## 2020-04-08 LAB — PREGNANCY, URINE: Preg Test, Ur: NEGATIVE

## 2020-04-08 LAB — URINALYSIS, ROUTINE W REFLEX MICROSCOPIC
Bilirubin Urine: NEGATIVE
Glucose, UA: NEGATIVE mg/dL
Ketones, ur: NEGATIVE mg/dL
Nitrite: POSITIVE — AB
Protein, ur: NEGATIVE mg/dL
Specific Gravity, Urine: 1.01 (ref 1.005–1.030)
pH: 6.5 (ref 5.0–8.0)

## 2020-04-08 LAB — TROPONIN I (HIGH SENSITIVITY)
Troponin I (High Sensitivity): 3 ng/L (ref <18)
Troponin I (High Sensitivity): 5 ng/L

## 2020-04-08 LAB — TSH: TSH: 1.365 u[IU]/mL (ref 0.350–4.500)

## 2020-04-08 LAB — RESP PANEL BY RT-PCR (FLU A&B, COVID) ARPGX2
Influenza A by PCR: NEGATIVE
Influenza B by PCR: NEGATIVE
SARS Coronavirus 2 by RT PCR: NEGATIVE

## 2020-04-08 LAB — MAGNESIUM: Magnesium: 1.8 mg/dL (ref 1.7–2.4)

## 2020-04-08 MED ORDER — HEPARIN (PORCINE) 25000 UT/250ML-% IV SOLN
1500.0000 [IU]/h | INTRAVENOUS | Status: DC
  Administered 2020-04-08: 1800 [IU]/h via INTRAVENOUS
  Administered 2020-04-09: 1600 [IU]/h via INTRAVENOUS
  Filled 2020-04-08 (×2): qty 250

## 2020-04-08 MED ORDER — SODIUM CHLORIDE 0.9 % IV SOLN
1.0000 g | Freq: Once | INTRAVENOUS | Status: AC
  Administered 2020-04-08: 1 g via INTRAVENOUS
  Filled 2020-04-08: qty 10

## 2020-04-08 MED ORDER — METOPROLOL TARTRATE 5 MG/5ML IV SOLN
5.0000 mg | Freq: Once | INTRAVENOUS | Status: AC
  Administered 2020-04-08: 5 mg via INTRAVENOUS
  Filled 2020-04-08: qty 5

## 2020-04-08 MED ORDER — ACETAMINOPHEN 325 MG PO TABS
650.0000 mg | ORAL_TABLET | Freq: Four times a day (QID) | ORAL | Status: DC | PRN
  Administered 2020-04-08 – 2020-04-09 (×2): 650 mg via ORAL
  Filled 2020-04-08 (×2): qty 2

## 2020-04-08 MED ORDER — HYDROCHLOROTHIAZIDE 25 MG PO TABS
25.0000 mg | ORAL_TABLET | Freq: Every day | ORAL | Status: DC
  Administered 2020-04-09: 25 mg via ORAL
  Filled 2020-04-08: qty 1

## 2020-04-08 MED ORDER — HEPARIN BOLUS VIA INFUSION
4000.0000 [IU] | Freq: Once | INTRAVENOUS | Status: AC
  Administered 2020-04-08: 4000 [IU] via INTRAVENOUS

## 2020-04-08 MED ORDER — LOSARTAN POTASSIUM 50 MG PO TABS
100.0000 mg | ORAL_TABLET | Freq: Every day | ORAL | Status: DC
  Administered 2020-04-09: 100 mg via ORAL
  Filled 2020-04-08: qty 2

## 2020-04-08 MED ORDER — DILTIAZEM HCL-DEXTROSE 125-5 MG/125ML-% IV SOLN (PREMIX)
5.0000 mg/h | INTRAVENOUS | Status: DC
  Administered 2020-04-08: 5 mg/h via INTRAVENOUS
  Administered 2020-04-09: 10 mg/h via INTRAVENOUS
  Filled 2020-04-08 (×2): qty 125

## 2020-04-08 MED ORDER — SODIUM CHLORIDE 0.9 % IV BOLUS
1000.0000 mL | Freq: Once | INTRAVENOUS | Status: AC
  Administered 2020-04-08: 1000 mL via INTRAVENOUS

## 2020-04-08 MED ORDER — METOPROLOL TARTRATE 25 MG PO TABS
25.0000 mg | ORAL_TABLET | Freq: Two times a day (BID) | ORAL | Status: DC
  Administered 2020-04-09: 25 mg via ORAL
  Filled 2020-04-08: qty 1

## 2020-04-08 MED ORDER — PANTOPRAZOLE SODIUM 40 MG PO TBEC
40.0000 mg | DELAYED_RELEASE_TABLET | Freq: Every evening | ORAL | Status: DC | PRN
  Administered 2020-04-08: 40 mg via ORAL
  Filled 2020-04-08: qty 1

## 2020-04-08 NOTE — ED Triage Notes (Signed)
Pt c/o palpitations/fast heart rate x 2 weeks-states sx started 1 week after starting new weight loss med-NAD-steady gait

## 2020-04-08 NOTE — ED Notes (Signed)
MD Tegeler made aware of HR. Patient on monitor in no active distress at this time.

## 2020-04-08 NOTE — H&P (Signed)
Referring Physician: Lynden Oxford  Judith Aguirre is an 50 y.o. female.                       Chief Complaint: Palpitation  HPI: 50 years old black female with PMH of Hypertension, morbid obesity, GERD and COVID pneumonia in 01/2019 has 2 weeks of palpitations and shortness of breath. She has some chest discomfort with activity that improves with rest.  She denies cough, fever or chills. EKG shows atrial fibrillation with RVR. Chest x-ray is unremarkable. Troponin I levels are normal. TSH is pending.  Past Medical History:  Diagnosis Date  . GERD (gastroesophageal reflux disease)   . Hypertension   . Morbid obesity due to excess calories [facility])       Past Surgical History:  Procedure Laterality Date  . DILATION AND CURETTAGE OF UTERUS    . KNEE SURGERY      Family History  Problem Relation Age of Onset  . Hypertension Mother    Social History:  reports that she has never smoked. She has never used smokeless tobacco. She reports current alcohol use. She reports that she does not use drugs.  Allergies:  Allergies  Allergen Reactions  . Lisinopril Shortness Of Breath and Other (See Comments)    Cough     Medications Prior to Admission  Medication Sig Dispense Refill  . amLODipine (NORVASC) 5 MG tablet Take 5 mg by mouth daily.    . hydrochlorothiazide (HYDRODIURIL) 25 MG tablet Take 25 mg by mouth daily.    Marland Kitchen ibuprofen (ADVIL) 800 MG tablet Take 800 mg by mouth every 8 (eight) hours as needed for headache, mild pain, moderate pain or cramping.     Marland Kitchen losartan (COZAAR) 100 MG tablet Take 100 mg by mouth daily.    . pantoprazole (PROTONIX) 40 MG tablet Take 40 mg by mouth at bedtime as needed (reflux).     . temazepam (RESTORIL) 30 MG capsule Take 30 mg by mouth at bedtime as needed for sleep.     . metoprolol tartrate (LOPRESSOR) 25 MG tablet Take 25 mg by mouth daily.      Results for orders placed or performed during the hospital encounter of 04/08/20 (from the past 48  hour(s))  Pregnancy, urine     Status: None   Collection Time: 04/08/20  5:15 PM  Result Value Ref Range   Preg Test, Ur NEGATIVE NEGATIVE    Comment:        THE SENSITIVITY OF THIS METHODOLOGY IS >20 mIU/mL. Performed at [facility], [address] [place], [place] [address]   Urinalysis, Routine w reflex microscopic Urine, Clean Catch     Status: Abnormal   Collection Time: 04/08/20  5:15 PM  Result Value Ref Range   Color, Urine YELLOW YELLOW   APPearance HAZY (A) CLEAR   Specific Gravity, Urine 1.010 1.005 - 1.030   pH 6.5 5.0 - 8.0   Glucose, UA NEGATIVE NEGATIVE mg/dL   Hgb urine dipstick SMALL (A) NEGATIVE   Bilirubin Urine NEGATIVE NEGATIVE   Ketones, ur NEGATIVE NEGATIVE mg/dL   Protein, ur NEGATIVE NEGATIVE mg/dL   Nitrite POSITIVE (A) NEGATIVE   Leukocytes,Ua SMALL (A) NEGATIVE    Comment: Performed at [facility], [address] [place], [place] [address]  Urinalysis, Microscopic (reflex)     Status: Abnormal   Collection Time: 04/08/20  5:15 PM  Result Value Ref Range   RBC /  HPF 0-5 0 - 5 RBC/hpf   WBC, UA 6-10 0 - 5 WBC/hpf   Bacteria, UA MANY (A) NONE SEEN   Squamous Epithelial / LPF 0-5 0 - 5    Comment: Performed at [facility], [address] [place], [place] [address]  Basic metabolic panel     Status: Abnormal   Collection Time: 04/08/20  5:20 PM  Result Value Ref Range   Sodium 137 135 - 145 mmol/L   Potassium 3.3 (L) 3.5 - 5.1 mmol/L   Chloride 101 98 - 111 mmol/L   CO2 26 22 - 32 mmol/L   Glucose, Bld 123 (H) 70 - 99 mg/dL    Comment: Glucose reference range applies only to samples taken after fasting for at least 8 hours.   BUN 12 6 - 20 mg/dL   Creatinine, Ser 8.18 0.44 - 1.00 mg/dL   Calcium 9.0 8.9 - 29.9 mg/dL   GFR, Estimated >37 >16 mL/min    Comment: (NOTE) Calculated using the CKD-EPI Creatinine Equation (2021)    Anion gap 10 5 - 15    Comment: Performed at [facility], [address] [place], [place] [address]  CBC     Status: None   Collection Time: 04/08/20  5:20 PM  Result Value Ref Range   WBC 8.9 4.0 - 10.5 K/uL   RBC 4.49 3.87 - 5.11 MIL/uL   Hemoglobin 13.4 12.0 - 15.0 g/dL   HCT 38.1 01.7 - 51.0 %   MCV 88.6 80.0 - 100.0 fL   MCH 29.8 26.0 - 34.0 pg   MCHC 33.7 30.0 - 36.0 g/dL   RDW 25.8 52.7 - 78.2 %   Platelets 326 150 - 400 K/uL   nRBC 0.0 0.0 - 0.2 %    Comment: Performed at [facility], [address] [place], [place] [address]  Troponin I (High Sensitivity)     Status: None   Collection Time: 04/08/20  5:20 PM  Result Value Ref Range   Troponin I (High Sensitivity) 3 <18 ng/L    Comment: (NOTE) Elevated high sensitivity troponin I (hsTnI) values and significant  changes across serial measurements may suggest ACS but many other  chronic and acute conditions are known to elevate hsTnI results.  Refer to the "Links" section for chest pain algorithms and additional  guidance. Performed at [facility], [address] [place], [place] [address]   Magnesium     Status: None   Collection Time: 04/08/20  5:20 PM  Result Value Ref Range   Magnesium 1.8 1.7 - 2.4 mg/dL    Comment: Performed at [facility], [address] [place], [place] [address]  Resp Panel by RT-PCR (Flu A&B, Covid) Nasopharyngeal Swab     Status: None   Collection Time: 04/08/20  6:01 PM   Specimen: Nasopharyngeal Swab; Nasopharyngeal(NP) swabs in vial transport medium  Result Value Ref Range   SARS Coronavirus 2 by RT PCR NEGATIVE NEGATIVE    Comment: (NOTE) SARS-CoV-2 target nucleic acids are NOT DETECTED.  The SARS-CoV-2 RNA is generally detectable in upper respiratory specimens during the acute phase of infection. The lowest concentration of SARS-CoV-2 viral copies this assay can detect is 138 copies/mL. A negative result does not preclude SARS-Cov-2 infection and should not be used as the sole basis for treatment or other  patient management decisions. A negative result may occur with  improper specimen collection/handling,  submission of specimen other than nasopharyngeal swab, presence of viral mutation(s) within the areas targeted by this assay, and inadequate number of viral copies(<138 copies/mL). A negative result must be combined with clinical observations, patient history, and epidemiological information. The expected result is Negative.  Fact Sheet for Patients:  BloggerCourse.comhttps://www.fda.gov/media/152166/download  Fact Sheet for Healthcare Providers:  SeriousBroker.ithttps://www.fda.gov/media/152162/download  This test is no t yet approved or cleared by the [place] FDA and  has been authorized for detection and/or diagnosis of SARS-CoV-2 by FDA under an Emergency Use Authorization (EUA). This EUA will remain  in effect (meaning this test can be used) for the duration of the COVID-19 declaration under Section 564(b)(1) of the Act, 21 U.S.C.section 360bbb-3(b)(1), unless the authorization is terminated  or revoked sooner.       Influenza A by PCR NEGATIVE NEGATIVE   Influenza B by PCR NEGATIVE NEGATIVE    Comment: (NOTE) The Xpert Xpress SARS-CoV-2/FLU/RSV plus assay is intended as an aid in the diagnosis of influenza from Nasopharyngeal swab specimens and should not be used as a sole basis for treatment. Nasal washings and aspirates are unacceptable for Xpert Xpress SARS-CoV-2/FLU/RSV testing.  Fact Sheet for Patients: BloggerCourse.comhttps://www.fda.gov/media/152166/download  Fact Sheet for Healthcare Providers: SeriousBroker.ithttps://www.fda.gov/media/152162/download  This test is not yet approved or cleared by the [place] FDA and has been authorized for detection and/or diagnosis of SARS-CoV-2 by FDA under an Emergency Use Authorization (EUA). This EUA will remain in effect (meaning this test can be used) for the duration of the COVID-19 declaration under Section 564(b)(1) of the Act, 21 U.S.C. section 360bbb-3(b)(1), unless  the authorization is terminated or revoked.  Performed at [facility], [address] [place], [place] [id number]    DG Chest [place] 1 View  Result Date: 04/08/2020 CLINICAL DATA:  Palpitation chest discomfort EXAM: PORTABLE CHEST 1 VIEW COMPARISON:  01/30/2019 FINDINGS: No focal opacity or pleural effusion. Borderline cardiomegaly. No pneumothorax IMPRESSION: Borderline cardiomegaly. Electronically Signed   By: Jasmine PangKim  Fujinaga M.D.   On: 04/08/2020 19:06    Review Of Systems Constitutional: No fever, chills, Positive Chronic weight gain. Eyes: No vision change, wears glasses. No discharge or pain. Ears: No hearing loss, No tinnitus. Respiratory: No asthma, COPD, pneumonias. Positive shortness of breath. No hemoptysis. Cardiovascular: Positive chest pain, palpitation, leg edema. Gastrointestinal: No nausea, vomiting, diarrhea, constipation. No GI bleed. No hepatitis. Genitourinary: No dysuria, hematuria, kidney stone. No incontinance. Neurological: No headache, stroke, seizures.  Psychiatry: No psych facility admission for anxiety, depression, suicide. No detox. Skin: No rash. Musculoskeletal: Positive joint pain, fibromyalgia, neck pain, back pain. Lymphadenopathy: No lymphadenopathy. Hematology: No anemia or easy bruising.   Blood pressure 140/88, pulse (!) 121, temperature 97.9 F (36.6 C), temperature source Oral, resp. rate 20, height 5\' 11"  (1.803 m), weight (!) 198.7 kg, SpO2 100 %. Body mass index is 61.1 kg/m. General appearance: alert, cooperative, appears stated age and mild respiratory distress Head: Normocephalic, atraumatic. Eyes: Brown eyes, pink conjunctiva, corneas clear. PERRL, EOM's intact. Neck: No adenopathy, no carotid bruit, no JVD, supple, symmetrical, trachea midline and thyroid not enlarged. Resp: Clear to auscultation bilaterally. Cardio: Irregular and rate and rhythm, S1, S2 normal, II/VI systolic murmur, no click, rub or gallop GI: Soft,  non-tender; bowel sounds normal; no organomegaly. Extremities: 1 + edema, cyanosis or clubbing. Skin: Warm and dry.  Neurologic: Alert and oriented X 3, normal strength.   Assessment/Plan Atrial fibrillation with RVR, CHA2DS2VASc score of 2 Hypertension Morbid obesity GERD Chest pain  Admit TEE with cardioversion if needed. Cardiac work-up when stable. Echocardiogram for LV function and r/o valvular disease and r/o LA + RA enlargement.  Time spent: Review of old records, Lab, x-rays, EKG, other cardiac tests, examination, discussion with patient/Nurse/Doctor over 70 minutes.  Ricki Rodriguez, MD  04/08/2020, 10:26 PM

## 2020-04-08 NOTE — Progress Notes (Signed)
ANTICOAGULATION CONSULT NOTE - Initial Consult  Pharmacy Consult for heparin Indication: atrial fibrillation  Allergies  Allergen Reactions  . Lisinopril Shortness Of Breath and Other (See Comments)    Cough     Patient Measurements: Height: 6' (182.9 cm) Weight: (!) 198.7 kg (438 lb) IBW/kg (Calculated) : 73.1 Heparin Dosing Weight: 123 kg   Vital Signs: Temp: 98.2 F (36.8 C) (02/14 1706) Temp Source: Oral (02/14 1706) BP: 186/89 (02/14 1818) Pulse Rate: 129 (02/14 1818)  Labs: Recent Labs    04/08/20 1720  HGB 13.4  HCT 39.8  PLT 326  CREATININE 0.98  TROPONINIHS 3    Estimated Creatinine Clearance: 135.2 mL/min (by C-G formula based on SCr of 0.98 mg/dL).   Medical History: Past Medical History:  Diagnosis Date  . GERD (gastroesophageal reflux disease)   . Hypertension   . Morbid obesity due to excess calories (HCC)     Medications:  (Not in a hospital admission)   Assessment: 58 YOF who presents with fatigue, chest discomfort and SOB found to have new onset AFib. Pharmacy consulted to start IV heparin.   H/H and Plt wnl. SCr wnl   Goal of Therapy:  Heparin level 0.3-0.7 units/ml Monitor platelets by anticoagulation protocol: Yes   Plan:  -Heparin 4000 units IV bolus followed by heparin infusion at 1800 units/hr  -F/u 6 hr HL -Monitor daily HL, CBC and s/s of bleeding  Vinnie Level, PharmD., BCPS, BCCCP Clinical Pharmacist Please refer to [facility] for unit-specific pharmacist

## 2020-04-08 NOTE — ED Notes (Signed)
PT asked for bedside. Brought but denied by PT. Reiterated walking would be high risk with HR. PT understood and bedside commode placed outside the room on standby.

## 2020-04-08 NOTE — ED Provider Notes (Signed)
[facility] EMERGENCY DEPARTMENT Provider Note   CSN: [id number] Arrival date & time: 04/08/20  1654     History Chief Complaint  Patient presents with  . Palpitations    Judith Aguirre is a 50 y.o. female.  The history is provided by the patient and medical records. No language interpreter was used.  Palpitations Palpitations quality:  Fast Onset quality:  Gradual Duration:  3 weeks Timing:  Constant Progression:  Waxing and waning Chronicity:  New Relieved by:  Nothing Worsened by:  Nothing Ineffective treatments:  None tried Associated symptoms: chest pain, chest pressure, malaise/fatigue, near-syncope and shortness of breath   Associated symptoms: no back pain, no cough, no diaphoresis, no dizziness, no hemoptysis, no leg pain, no lower extremity edema, no nausea, no numbness, no vomiting and no weakness        Past Medical History:  Diagnosis Date  . GERD (gastroesophageal reflux disease)   . Hypertension   . Morbid obesity due to excess calories [facility])     Patient Active Problem List   Diagnosis Date Noted  . Acute respiratory failure with hypoxemia (HCC) 01/30/2019  . Multifocal pneumonia 01/30/2019  . COVID-19 01/30/2019  . Hypertension   . Morbid obesity due to excess calories (HCC)   . GERD (gastroesophageal reflux disease)     Past Surgical History:  Procedure Laterality Date  . DILATION AND CURETTAGE OF UTERUS    . KNEE SURGERY       OB History   No obstetric history on file.     Family History  Problem Relation Age of Onset  . Hypertension Mother     Social History   Tobacco Use  . Smoking status: Never Smoker  . Smokeless tobacco: Never Used  Vaping Use  . Vaping Use: Never used  Substance Use Topics  . Alcohol use: Yes    Comment: occ  . Drug use: Never    Home Medications Prior to Admission medications   Medication Sig Start Date End Date Taking? Authorizing Provider  amLODipine (NORVASC) 5 MG tablet Take 5 mg  by mouth daily.   Yes [provider]  hydrochlorothiazide (HYDRODIURIL) 25 MG tablet Take 25 mg by mouth daily. 12/10/18  Yes [provider]  ibuprofen (ADVIL) 800 MG tablet Take 800 mg by mouth every 8 (eight) hours as needed for headache, mild pain, moderate pain or cramping.  12/10/18  Yes [provider]  losartan (COZAAR) 100 MG tablet Take 100 mg by mouth daily. 11/16/18  Yes [provider]  pantoprazole (PROTONIX) 40 MG tablet Take 40 mg by mouth at bedtime as needed (reflux).  12/10/18  Yes [provider]  temazepam (RESTORIL) 30 MG capsule Take 30 mg by mouth at bedtime as needed for sleep.  12/06/18  Yes [provider]  dexamethasone (DECADRON) 6 MG tablet Take 1 tablet (6 mg total) by mouth daily. 02/03/19   Ghimire, Werner Lean, MD  metoprolol tartrate (LOPRESSOR) 25 MG tablet Take 25 mg by mouth daily. 12/10/18   [provider]    Allergies    Lisinopril  Review of Systems   Review of Systems  Constitutional: Positive for fatigue and malaise/fatigue. Negative for chills, diaphoresis and fever.  HENT: Negative for congestion.   Eyes: Negative for visual disturbance.  Respiratory: Positive for chest tightness and shortness of breath. Negative for cough, hemoptysis, wheezing and stridor.   Cardiovascular: Positive for chest pain, palpitations and near-syncope. Negative for leg swelling.  Gastrointestinal: Negative  for abdominal pain, constipation, diarrhea, nausea and vomiting.  Genitourinary: Negative for dysuria and flank pain.  Musculoskeletal: Negative for back pain, neck pain and neck stiffness.  Skin: Negative for rash and wound.  Neurological: Positive for light-headedness. Negative for dizziness, syncope, weakness, numbness and headaches.  Psychiatric/Behavioral: Negative for agitation.  All other systems reviewed and are negative.   Physical Exam Updated Vital Signs BP (!) 145/101   Pulse (!) 144    Temp 98.2 F (36.8 C) (Oral)   Resp 15   Ht 6' (1.829 m)   Wt (!) 198.7 kg   SpO2 100%   BMI 59.40 kg/m   Physical Exam Vitals and nursing note reviewed.  Constitutional:      General: She is not in acute distress.    Appearance: She is well-developed and well-nourished. She is not ill-appearing, toxic-appearing or diaphoretic.  HENT:     Head: Normocephalic and atraumatic.     Nose: No congestion or rhinorrhea.     Mouth/Throat:     Mouth: Mucous membranes are moist.     Pharynx: No oropharyngeal exudate or posterior oropharyngeal erythema.  Eyes:     Extraocular Movements: Extraocular movements intact.     Conjunctiva/sclera: Conjunctivae normal.     Pupils: Pupils are equal, round, and reactive to light.  Cardiovascular:     Rate and Rhythm: Tachycardia present. Rhythm irregular.     Pulses: Normal pulses.     Heart sounds: No murmur heard.   Pulmonary:     Effort: Pulmonary effort is normal. No respiratory distress.     Breath sounds: Normal breath sounds. No wheezing, rhonchi or rales.  Chest:     Chest wall: No tenderness.  Abdominal:     General: Abdomen is flat.     Palpations: Abdomen is soft.     Tenderness: There is no abdominal tenderness. There is no right CVA tenderness, left CVA tenderness, guarding or rebound.  Musculoskeletal:        General: No tenderness or edema.     Cervical back: Neck supple. No tenderness.     Right lower leg: No edema.     Left lower leg: No edema.  Skin:    General: Skin is warm and dry.     Capillary Refill: Capillary refill takes less than 2 seconds.     Findings: No erythema.  Neurological:     General: No focal deficit present.     Mental Status: She is alert.  Psychiatric:        Mood and Affect: Mood and affect and mood normal.     ED Results / Procedures / Treatments   Labs (all labs ordered are listed, but only abnormal results are displayed) Labs Reviewed  BASIC METABOLIC PANEL - Abnormal; Notable for the  following components:      Result Value   Potassium 3.3 (*)    Glucose, Bld 123 (*)    All other components within normal limits  URINALYSIS, ROUTINE W REFLEX MICROSCOPIC - Abnormal; Notable for the following components:   APPearance HAZY (*)    Hgb urine dipstick SMALL (*)    Nitrite POSITIVE (*)    Leukocytes,Ua SMALL (*)    All other components within normal limits  URINALYSIS, MICROSCOPIC (REFLEX) - Abnormal; Notable for the following components:   Bacteria, UA MANY (*)    All other components within normal limits  RESP PANEL BY RT-PCR (FLU A&B, COVID) ARPGX2  CBC  PREGNANCY, URINE  MAGNESIUM  TSH  BASIC METABOLIC PANEL  HEPARIN LEVEL (UNFRACTIONATED)  CBC  TROPONIN I (HIGH SENSITIVITY)  TROPONIN I (HIGH SENSITIVITY)    EKG EKG Interpretation  Date/Time:  Monday April 08 2020 16:55:01 EST Ventricular Rate:  175 PR Interval:    QRS Duration: 78 QT Interval:  278 QTC Calculation: 474 R Axis:   72 Text Interpretation: Atrial fibrillation with rapid ventricular response Marked ST abnormality, possible inferior subendocardial injury Abnormal ECG when compared to prior, now appears to show afib with RVR No STEMI Confirmed by Theda Belfast ([id number]) on 04/08/2020 5:29:13 PM   Radiology DG Chest Port 1 View  Result Date: 04/08/2020 CLINICAL DATA:  Palpitation chest discomfort EXAM: PORTABLE CHEST 1 VIEW COMPARISON:  01/30/2019 FINDINGS: No focal opacity or pleural effusion. Borderline cardiomegaly. No pneumothorax IMPRESSION: Borderline cardiomegaly. Electronically Signed   By: Jasmine Pang M.D.   On: 04/08/2020 19:06    Procedures Procedures   CRITICAL CARE Performed by: Canary Brim Adryen Cookson Total critical care time: 50 minutes Critical care time was exclusive of separately billable procedures and treating other patients. Critical care was necessary to treat or prevent imminent or life-threatening deterioration. Critical care was time spent personally by me on the  following activities: development of treatment plan with patient and/or surrogate as well as nursing, discussions with consultants, evaluation of patient's response to treatment, examination of patient, obtaining history from patient or surrogate, ordering and performing treatments and interventions, ordering and review of laboratory studies, ordering and review of radiographic studies, pulse oximetry and re-evaluation of patient's condition.   Medications Ordered in ED Medications  diltiazem (CARDIZEM) 125 mg in dextrose 5% 125 mL (1 mg/mL) infusion (10 mg/hr Intravenous Rate/Dose Change 04/08/20 2155)  heparin ADULT infusion 100 units/mL (25000 units/240mL) (1,800 Units/hr Intravenous New Bag/Given 04/08/20 2028)  metoprolol tartrate (LOPRESSOR) tablet 25 mg (has no administration in time range)  hydrochlorothiazide (HYDRODIURIL) tablet 25 mg (has no administration in time range)  losartan (COZAAR) tablet 100 mg (has no administration in time range)  pantoprazole (PROTONIX) EC tablet 40 mg (40 mg Oral Given 04/08/20 2246)  acetaminophen (TYLENOL) tablet 650 mg (650 mg Oral Given 04/08/20 2259)  digoxin (LANOXIN) 0.25 MG/ML injection 0.25 mg (has no administration in time range)  potassium chloride (KLOR-CON) CR tablet 20 mEq (has no administration in time range)  sodium chloride 0.9 % bolus 1,000 mL (0 mLs Intravenous Stopped 04/08/20 2022)  metoprolol tartrate (LOPRESSOR) injection 5 mg (5 mg Intravenous Given 04/08/20 2018)  cefTRIAXone (ROCEPHIN) 1 g in sodium chloride 0.9 % 100 mL IVPB ( Intravenous Stopped 04/08/20 2100)  heparin bolus via infusion 4,000 Units (4,000 Units Intravenous Bolus from Bag 04/08/20 2028)  metoprolol tartrate (LOPRESSOR) injection 5 mg (5 mg Intravenous Given 04/08/20 2247)    ED Course  I have reviewed the triage vital signs and the nursing notes.  Pertinent labs & imaging results that were available during my care of the patient were reviewed by me and considered in  my medical decision making (see chart for details).    MDM Rules/Calculators/A&P                          AHSLEY ATTWOOD is a 50 y.o. female with a past medical history significant for hypertension, GERD, and morbid obese only currently getting weekly shots of Wegovy who presents with fatigue, chest discomfort, shortness of breath, and palpitations.  Patient reports that for the last 3 weeks, she has been having the symptoms  but they worsened over the last few days.  She says that years ago she had some palpitations but never had been told she had any significant arrhythmias.  She reports that she was on different weight loss medications recently and has been on this new one, wegovy for the last few weeks.  She reports that she has had no energy for the last few weeks and having increasing palpitations.  She denies any nausea, vomiting, urinary changes or bowel changes.  No recent leg pain or leg swelling.  The discomfort in her chest is more of a tightness and aching and it does not radiate.  On exam, lungs are clear and chest is nontender.  Abdomen is nontender.  Good pulses in extremities.  Legs are nontender and nonedematous per her.  Patient resting comfortably but her heart rate is in the 150s up to 170s with clear A. Fib with RVR.  EKG confirmed the A. fib with RVR with no STEMI.  Clinically I am concerned she is been in A. fib for the last few weeks and then more recently over the weekend went into RVR.  I am unsure if this is related to recent medication changes or what caused this but we will give her some diltiazem to help slow her heart rate down.  We will get work-up to look for occult infection or electrolyte imbalance with these new medications but will touch base with cardiology when work-up is completed to discuss admission for new A. fib with RVR.  As she has had symptoms for several weeks, do not feel she is a candidate for ED cardioversion at this time.  Anticipate admission after  work-up is completed  Work-up returned and she is Covid and flu negative.  Metabolic panel shows mild hypokalemia but otherwise it is reassuring.  CBC reassuring.  Magnesium normal and troponin nonelevated.  She is not pregnant.  Urinalysis does show UTI with nitrites, leukocytes, and bacteria.  Culture was sent but will treat for UTI.  Chest x-ray shows borderline cardiomegaly.  Given her new A. fib with RVR, she was started on diltiazem.  Heart rate is still between 120 and 150.  Cardiology was called who recommended admission to [facility] for further management.  They agreed with heparinization which was ordered.  They recommended adding metoprolol which was ordered.  [facility] further management of new A. fib with RVR with associated discomfort palpitations and fatigue as well as the UTI.  Patient is awaiting admission   Final Clinical Impression(s) / ED Diagnoses Final diagnoses:  Atrial fibrillation with RVR (HCC)  New onset atrial fibrillation (HCC)  Palpitations  Chest pressure  Acute cystitis without hematuria     Clinical Impression: 1. Atrial fibrillation with RVR (HCC)   2. Chest discomfort   3. New onset atrial fibrillation (HCC)   4. Palpitations   5. Chest pressure   6. Acute cystitis without hematuria     Disposition: Admit  This note was prepared with assistance of Dragon voice recognition software. Occasional wrong-word or sound-a-like substitutions may have occurred due to the inherent limitations of voice recognition software.     Roshelle Traub, Canary Brimhristopher J, MD 04/09/20 [phone]

## 2020-04-09 ENCOUNTER — Inpatient Hospital Stay (HOSPITAL_COMMUNITY): Payer: PRIVATE HEALTH INSURANCE

## 2020-04-09 LAB — CBC
HCT: 41.1 % (ref 36.0–46.0)
Hemoglobin: 13.4 g/dL (ref 12.0–15.0)
MCH: 29.2 pg (ref 26.0–34.0)
MCHC: 32.6 g/dL (ref 30.0–36.0)
MCV: 89.5 fL (ref 80.0–100.0)
Platelets: 282 10*3/uL (ref 150–400)
RBC: 4.59 MIL/uL (ref 3.87–5.11)
RDW: 12.9 % (ref 11.5–15.5)
WBC: 8.5 10*3/uL (ref 4.0–10.5)
nRBC: 0 % (ref 0.0–0.2)

## 2020-04-09 LAB — BASIC METABOLIC PANEL
Anion gap: 9 (ref 5–15)
BUN: 8 mg/dL (ref 6–20)
CO2: 27 mmol/L (ref 22–32)
Calcium: 9.1 mg/dL (ref 8.9–10.3)
Chloride: 104 mmol/L (ref 98–111)
Creatinine, Ser: 0.8 mg/dL (ref 0.44–1.00)
GFR, Estimated: >60 mL/min (ref >60)
Glucose, Bld: 117 mg/dL — ABNORMAL HIGH (ref 70–99)
Potassium: 3.5 mmol/L (ref 3.5–5.1)
Sodium: 140 mmol/L (ref 135–145)

## 2020-04-09 LAB — HEPARIN LEVEL (UNFRACTIONATED)
Heparin Unfractionated: 1.03 IU/mL — ABNORMAL HIGH (ref 0.30–0.70)
Heparin Unfractionated: 0.72 IU/mL — ABNORMAL HIGH (ref 0.30–0.70)

## 2020-04-09 LAB — ECHOCARDIOGRAM COMPLETE
Area-P 1/2: 2.5 cm2
Height: 71 in
S' Lateral: 2.6 cm
Weight: 7009.6 oz

## 2020-04-09 MED ORDER — CIPROFLOXACIN HCL 500 MG PO TABS
250.0000 mg | ORAL_TABLET | Freq: Two times a day (BID) | ORAL | Status: DC
  Administered 2020-04-09: 250 mg via ORAL
  Filled 2020-04-09: qty 1

## 2020-04-09 MED ORDER — APIXABAN 5 MG PO TABS
5.0000 mg | ORAL_TABLET | Freq: Two times a day (BID) | ORAL | Status: DC
  Administered 2020-04-09: 5 mg via ORAL
  Filled 2020-04-09: qty 1

## 2020-04-09 MED ORDER — DILTIAZEM HCL ER COATED BEADS 120 MG PO CP24
240.0000 mg | ORAL_CAPSULE | Freq: Every day | ORAL | Status: DC
  Administered 2020-04-09: 240 mg via ORAL
  Filled 2020-04-09: qty 2

## 2020-04-09 MED ORDER — DIGOXIN 0.25 MG/ML IJ SOLN
0.2500 mg | Freq: Once | INTRAMUSCULAR | Status: AC
  Administered 2020-04-09: 0.25 mg via INTRAVENOUS
  Filled 2020-04-09 (×2): qty 1

## 2020-04-09 MED ORDER — POTASSIUM CHLORIDE ER 20 MEQ PO TBCR: 10.0000 meq | EXTENDED_RELEASE_TABLET | Freq: Every day | ORAL | 3 refills | Status: AC

## 2020-04-09 MED ORDER — CIPROFLOXACIN HCL 250 MG PO TABS: 250.0000 mg | ORAL_TABLET | Freq: Two times a day (BID) | ORAL | 0 refills | Status: DC

## 2020-04-09 MED ORDER — POTASSIUM CHLORIDE ER 10 MEQ PO TBCR
20.0000 meq | EXTENDED_RELEASE_TABLET | Freq: Two times a day (BID) | ORAL | Status: DC
  Administered 2020-04-09 (×2): 20 meq via ORAL
  Filled 2020-04-09 (×5): qty 2

## 2020-04-09 MED ORDER — DILTIAZEM HCL ER COATED BEADS 240 MG PO CP24: 240.0000 mg | ORAL_CAPSULE | Freq: Every day | ORAL | 1 refills | Status: AC

## 2020-04-09 MED ORDER — METOPROLOL TARTRATE 25 MG PO TABS: 25.0000 mg | ORAL_TABLET | Freq: Two times a day (BID) | ORAL | 1 refills | Status: AC

## 2020-04-09 MED ORDER — APIXABAN 5 MG PO TABS: 5.0000 mg | ORAL_TABLET | Freq: Two times a day (BID) | ORAL | 1 refills | Status: AC

## 2020-04-09 NOTE — Progress Notes (Signed)
*  PRELIMINARY RESULTS* Echocardiogram 2D Echocardiogram has been performed.  Jeryl Columbia 04/09/2020, 12:24 PM

## 2020-04-09 NOTE — Progress Notes (Signed)
Patient received via [facility] alert and Orin. Orin. To room and [facility]. into assess patient. Patient remains in At. Fib 120'-140's with activity. Cont. To monitor patient and rhythm. Dr. Algie Coffer to be notified of arrival.

## 2020-04-09 NOTE — Progress Notes (Signed)
ANTICOAGULATION CONSULT NOTE  Pharmacy Consult for heparin Indication: atrial fibrillation  Allergies  Allergen Reactions  . Lisinopril Shortness Of Breath and Other (See Comments)    Cough     Patient Measurements: Height: 5\' 11"  (180.3 cm) Weight: (!) 198.7 kg (438 lb 1.6 oz) IBW/kg (Calculated) : 70.8 Heparin Dosing Weight: 123 kg   Vital Signs: Temp: 98.1 F (36.7 C) (02/15 0222) Temp Source: Oral (02/15 0222) BP: 139/69 (02/15 0400) Pulse Rate: 72 (02/15 0400)  Labs: Recent Labs    04/08/20 1720 04/08/20 2225 04/09/20 0333  HGB 13.4  --  13.4  HCT 39.8  --  41.1  PLT 326  --  282  HEPARINUNFRC  --   --  1.03*  CREATININE 0.98  --  0.80  TROPONINIHS 3 5  --     Estimated Creatinine Clearance: 163.8 mL/min (by C-G formula based on SCr of 0.8 mg/dL).  Assessment: 50 y.o. female with Afib for heparin  Goal of Therapy:  Heparin level 0.3-0.7 units/ml Monitor platelets by anticoagulation protocol: Yes   Plan:  Decrease Heparin 1600 units/hr Check heparin level in 8 hours.  54, PharmD, BCPS

## 2020-04-09 NOTE — Discharge Summary (Signed)
Physician Discharge Summary  Patient ID: Judith Aguirre MRN: [id number] DOB/AGE: Aug 27, 1970 50 y.o.  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 She was started on IV heparin, IV diltiazem, digoxin and metoprolol. She converted to sinus rhythm. After echocardiogram showing HCM without obstruction, digoxin was discontinued. She was switched to oral diltiazem, metoprolol and also placed on Eliquis. She had UTI treated with one dose of IV ceftriaxone followed by ciprofloxacin 250 mg. Bid x 5 days.  She ambulated well without chest pain and was discharged home in stable condition with follow up by me in 1 week and by primary care in 2 weeks.  Consults: cardiology  Significant Diagnostic Studies: labs: Normal CBC, BMET, Hyperglycemia. Normal TSH and Troponin I levels.  EKG: Atrial fibrillation with RVR  Chest x-ray: Borderline cardiomegaly.  Echocardiogram: Hyperdynamic LV systolic function with EF 70-75 %.  Treatments: cardiac meds: metoprolol and diltiazem, potassium, Losartan, HCTZ and Eliquis  Discharge Exam: Blood pressure 117/85, pulse 81, temperature (!) 96.9 F (36.1 C), temperature source Axillary, resp. rate 16, height 5\' 11"  (1.803 m), weight (!) 198.7 kg, SpO2 100 %. General appearance: alert, cooperative and appears stated age. Head: Normocephalic, atraumatic. Eyes: Brown eyes, pink conjunctiva, corneas clear. PERRL, EOM's intact.  Neck: No adenopathy, no  carotid bruit, no JVD, supple, symmetrical, trachea midline and thyroid not enlarged. Resp: Clear to auscultation bilaterally. Cardio: Regular rate and rhythm, S1, S2 normal, II/VI systolic murmur, no click, rub or gallop. GI: Soft, non-tender; bowel sounds normal; no organomegaly. Extremities: 1 + edema, no cyanosis or clubbing. Skin: Warm and dry.  Neurologic: Alert and oriented X 3, normal strength and tone. Normal coordination and slow gait.  Disposition: Discharge disposition: 01-Home or Self Care        Allergies as of 04/09/2020      Reactions   Lisinopril Shortness Of Breath, Other (See Comments)   Cough       Medication List    STOP taking these medications   amLODipine 5 MG tablet Commonly known as: NORVASC   ibuprofen 800 MG tablet Commonly known as: ADVIL     TAKE these medications   apixaban 5 MG Tabs tablet Commonly known as: ELIQUIS Take 1 tablet (5 mg total) by mouth 2 (two) times daily. Start taking on: April 10, 2020   ciprofloxacin 250 MG tablet Commonly known as: CIPRO Take 1 tablet (250 mg total) by mouth 2 (two) times daily.   diltiazem 240 MG 24 hr capsule Commonly known as: CARDIZEM CD Take 1 capsule (240 mg total) by mouth daily. Start taking on: April 10, 2020   hydrochlorothiazide 25 MG tablet Commonly known as: HYDRODIURIL Take 25 mg by mouth daily.   losartan 100 MG tablet Commonly known as: COZAAR Take 100 mg by mouth daily.   metoprolol tartrate 25 MG tablet Commonly known as: LOPRESSOR Take 1 tablet (25 mg total) by mouth 2 (two) times daily. What changed: when to take this   pantoprazole 40 MG tablet Commonly known as: PROTONIX Take 40 mg by mouth at bedtime as needed (reflux).  Potassium Chloride ER 20 MEQ Tbcr Take 10 mEq by mouth daily.   temazepam 30 MG capsule Commonly known as: RESTORIL Take 30 mg by mouth at bedtime as needed for sleep.       Follow-up Information    Angelica Chessman, MD. Schedule  an appointment as soon as possible for a visit in 2 week(s).   Specialty: Family Medicine Contact information: [address] [place] [address] [phone]        Orpah Cobb, MD Follow up in 1 week(s).   Specialty: Cardiology Contact information: [address] [place] [address] [phone]               Time spent: Review of old chart, current chart, lab, x-ray, cardiac tests and discussion with patient/Nurse/Family over 60 minutes.  Signed: Ricki Rodriguez 04/09/2020, 6:57 PM

## 2020-04-09 NOTE — Progress Notes (Signed)
Discharge instructions provided to patient. Medications and follow-up appointments reviewed and medication coupons provided. All questions answered. IVs removed. Patient to be escorted home by her husband.   Allegra Grana RN

## 2020-04-09 NOTE — Progress Notes (Signed)
Patient resting in bed and converted to S.R. at 2:12 a.m. in the 70's E.K.G. done to confirm Stephanine R.N. aware

## 2020-04-09 NOTE — Progress Notes (Signed)
ANTICOAGULATION CONSULT NOTE  Pharmacy Consult for heparin Indication: atrial fibrillation  Allergies  Allergen Reactions  . Lisinopril Shortness Of Breath and Other (See Comments)    Cough     Patient Measurements: Height: 5\' 11"  (180.3 cm) Weight: (!) 198.7 kg (438 lb 1.6 oz) IBW/kg (Calculated) : 70.8 Heparin Dosing Weight: 123 kg   Vital Signs: Temp: 98.5 F (36.9 C) (02/15 1257) Temp Source: Oral (02/15 1257) BP: 137/93 (02/15 1257) Pulse Rate: 81 (02/15 1206)  Labs: Recent Labs    04/08/20 1720 04/08/20 2225 04/09/20 0333 04/09/20 1422  HGB 13.4  --  13.4  --   HCT 39.8  --  41.1  --   PLT 326  --  282  --   HEPARINUNFRC  --   --  1.03* 0.72*  CREATININE 0.98  --  0.80  --   TROPONINIHS 3 5  --   --     Estimated Creatinine Clearance: 163.8 mL/min (by C-G formula based on SCr of 0.8 mg/dL).  Assessment: 50 y.o. female with Afib for heparin Heparin level now 0.72  Goal of Therapy:  Heparin level 0.3-0.7 units/ml Monitor platelets by anticoagulation protocol: Yes   Plan:  Decrease Heparin 1500 units/hr Follow up AM labs  Thank you 54, PharmD

## 2021-01-02 ENCOUNTER — Other Ambulatory Visit: Payer: Self-pay

## 2021-01-02 ENCOUNTER — Emergency Department (HOSPITAL_BASED_OUTPATIENT_CLINIC_OR_DEPARTMENT_OTHER)
Admission: EM | Admit: 2021-01-02 | Discharge: 2021-01-03 | Disposition: A | Discharge status: Home / Self Care | Payer: PRIVATE HEALTH INSURANCE | Attending: Emergency Medicine | Admitting: Emergency Medicine

## 2021-01-02 ENCOUNTER — Encounter (HOSPITAL_BASED_OUTPATIENT_CLINIC_OR_DEPARTMENT_OTHER): Payer: Self-pay | Admitting: *Deleted

## 2021-01-02 ENCOUNTER — Emergency Department (HOSPITAL_BASED_OUTPATIENT_CLINIC_OR_DEPARTMENT_OTHER): Payer: PRIVATE HEALTH INSURANCE

## 2021-01-02 DIAGNOSIS — Z79899 Other long term (current) drug therapy: Secondary | ICD-10-CM | POA: Diagnosis not present

## 2021-01-02 DIAGNOSIS — Z7901 Long term (current) use of anticoagulants: Secondary | ICD-10-CM | POA: Diagnosis not present

## 2021-01-02 DIAGNOSIS — M79604 Pain in right leg: Secondary | ICD-10-CM | POA: Diagnosis present

## 2021-01-02 DIAGNOSIS — I4891 Unspecified atrial fibrillation: Secondary | ICD-10-CM | POA: Diagnosis not present

## 2021-01-02 DIAGNOSIS — M25561 Pain in right knee: Secondary | ICD-10-CM | POA: Diagnosis not present

## 2021-01-02 DIAGNOSIS — M25551 Pain in right hip: Secondary | ICD-10-CM | POA: Diagnosis not present

## 2021-01-02 DIAGNOSIS — Z8616 Personal history of COVID-19: Secondary | ICD-10-CM | POA: Insufficient documentation

## 2021-01-02 DIAGNOSIS — I10 Essential (primary) hypertension: Secondary | ICD-10-CM | POA: Insufficient documentation

## 2021-01-02 DIAGNOSIS — F419 Anxiety disorder, unspecified: Secondary | ICD-10-CM | POA: Diagnosis not present

## 2021-01-02 HISTORY — DX: Unspecified atrial fibrillation: I48.91

## 2021-01-02 MED ORDER — HYDROCODONE-ACETAMINOPHEN 5-325 MG PO TABS
1.0000 | ORAL_TABLET | Freq: Once | ORAL | Status: AC
  Administered 2021-01-02: 1 via ORAL
  Filled 2021-01-02: qty 1

## 2021-01-02 NOTE — ED Triage Notes (Signed)
Right calf pain that started today. Also reports feeling 'jittery'.  Pt reports slight SOB, denies cough.

## 2021-01-02 NOTE — ED Provider Notes (Signed)
[facility] EMERGENCY DEPARTMENT Provider Note   CSN: [id number] Arrival date & time: 01/02/21  2228     History Chief Complaint  Patient presents with   Leg Pain    Judith Aguirre is a 50 y.o. female.  The history is provided by the patient and a significant other.  Leg Pain Location:  Knee Knee location:  R knee Pain details:    Quality:  Aching   Severity:  Moderate   Onset quality:  Gradual   Timing:  Constant   Progression:  Worsening Chronicity:  Recurrent Relieved by:  Rest Worsened by:  Bearing weight Associated symptoms: swelling   Associated symptoms: no fever   Patient with history of paroxysmal atrial fibrillation on Eliquis, GERD, hypertension, obesity presents with right leg pain.  Patient reports ever since she had arthroscopic knee surgery for meniscal repair, she has been having pain behind her knee.  She reports it hurts to walk.  She also reports right hip pain.  No recent trauma.  She reports the leg does swell at times because she sits down for work.  She reports earlier in the day, she began having worsening pain in the leg, and then she became very anxious and "jittery " She reports she felt short of breath but no chest pain.  She also reports headache likely due to elevated blood pressure. No focal weakness.    Past Medical History:  Diagnosis Date   A-fib [facility])    GERD (gastroesophageal reflux disease)    Hypertension    Morbid obesity due to excess calories [facility])     Patient Active Problem List   Diagnosis Date Noted   Atrial fibrillation with RVR (HCC) 04/08/2020   Acute respiratory failure with hypoxemia (HCC) 01/30/2019   Multifocal pneumonia 01/30/2019   COVID-19 01/30/2019   Hypertension    Morbid obesity due to excess calories (HCC)    GERD (gastroesophageal reflux disease)     Past Surgical History:  Procedure Laterality Date   DILATION AND CURETTAGE OF UTERUS     KNEE SURGERY       OB History   No obstetric  history on file.     Family History  Problem Relation Age of Onset   Hypertension Mother     Social History   Tobacco Use   Smoking status: Never   Smokeless tobacco: Never  Vaping Use   Vaping Use: Never used  Substance Use Topics   Alcohol use: Yes    Comment: occ   Drug use: Never    Home Medications Prior to Admission medications   Medication Sig Start Date End Date Taking? Authorizing Provider  apixaban (ELIQUIS) 5 MG TABS tablet Take 1 tablet (5 mg total) by mouth 2 (two) times daily. 04/10/20   Orpah Cobb, MD  diltiazem (CARDIZEM CD) 240 MG 24 hr capsule Take 1 capsule (240 mg total) by mouth daily. 04/10/20   Orpah Cobb, MD  hydrochlorothiazide (HYDRODIURIL) 25 MG tablet Take 25 mg by mouth daily. 12/10/18   [provider]  losartan (COZAAR) 100 MG tablet Take 100 mg by mouth daily. 11/16/18   [provider]  metoprolol tartrate (LOPRESSOR) 25 MG tablet Take 1 tablet (25 mg total) by mouth 2 (two) times daily. 04/09/20   Orpah Cobb, MD  pantoprazole (PROTONIX) 40 MG tablet Take 40 mg by mouth at bedtime as needed (reflux).  12/10/18   [provider]  potassium chloride 20 MEQ TBCR Take 10 mEq by mouth  daily. 04/09/20   Orpah Cobb, MD  temazepam (RESTORIL) 30 MG capsule Take 30 mg by mouth at bedtime as needed for sleep.  12/06/18   [provider]    Allergies    Lisinopril  Review of Systems   Review of Systems  Constitutional:  Negative for fever.  Respiratory:  Positive for shortness of breath.   Cardiovascular:  Negative for chest pain.  Gastrointestinal:  Negative for abdominal pain.  Musculoskeletal:  Positive for arthralgias and joint swelling.  Neurological:  Positive for headaches. Negative for weakness and numbness.  Psychiatric/Behavioral:  The patient is nervous/anxious.   All other systems reviewed and are negative.  Physical Exam Updated Vital Signs BP (!) 164/82 (BP Location: Right Arm)   Pulse 72    Temp 98.4 F (36.9 C) (Oral)   Resp 20   Ht 1.803 m (5\' 11" )   Wt (!) 190.5 kg   SpO2 100%   BMI 58.58 kg/m   Physical Exam CONSTITUTIONAL: Well developed/well nourished HEAD: Normocephalic/atraumatic EYES: EOMI/PERRL ENMT: Mucous membranes moist NECK: supple no meningeal signs SPINE/BACK:entire spine nontender CV: S1/S2 noted, no murmurs/rubs/gallops noted LUNGS: Lungs are clear to auscultation bilaterally, no apparent distress ABDOMEN: soft, nontender, no rebound or guarding, bowel sounds noted throughout abdomen GU:no cva tenderness NEURO: Pt is awake/alert/appropriate, moves all extremitiesx4.  No facial droop.  No arm or leg drift EXTREMITIES: pulses normal/equal, full ROM Tenderness noted to the popliteal fossa of the right knee.  No thrill is noted.  There is no erythema noted to the right knee.  Full range of motion of right hip and knee is noted No deformities Exam is limited due to obesity, but overall her legs appear symmetric. SKIN: warm, color normal PSYCH: no abnormalities of mood noted, alert and oriented to situation  ED Results / Procedures / Treatments   Labs (all labs ordered are listed, but only abnormal results are displayed) Labs Reviewed - No data to display  EKG EKG Interpretation  Date/Time:  Thursday January 02 2021 22:49:18 EST Ventricular Rate:  68 PR Interval:  186 QRS Duration: 91 QT Interval:  416 QTC Calculation: 443 R Axis:   50 Text Interpretation: Sinus rhythm No significant change since last tracing Confirmed by [id number] [phone]) on 01/02/2021 11:24:59 PM  Radiology 13/11/2020 Venous Img Lower Unilateral Right (DVT)  Result Date: 01/03/2021 CLINICAL DATA:  Right leg pain/swelling EXAM: RIGHT LOWER EXTREMITY VENOUS DOPPLER ULTRASOUND TECHNIQUE: Gray-scale sonography with compression, as well as color and duplex ultrasound, were performed to evaluate the deep venous system(s) from the level of the common femoral vein through the  popliteal and proximal calf veins. COMPARISON:  None. FINDINGS: VENOUS Normal compressibility of the common femoral, superficial femoral, and popliteal veins, as well as the visualized calf veins. Visualized portions of profunda femoral vein and great saphenous vein unremarkable. No filling defects to suggest DVT on grayscale or color Doppler imaging. Doppler waveforms show normal direction of venous flow, normal respiratory plasticity and response to augmentation. Limited views of the contralateral common femoral vein are unremarkable. OTHER None. Limitations: none IMPRESSION: Negative. Electronically Signed   By: 13/12/2020 M.D.   On: 01/03/2021 00:24    Procedures Procedures   Medications Ordered in ED Medications  HYDROcodone-acetaminophen (NORCO/VICODIN) 5-325 MG per tablet 1 tablet (1 tablet Oral Given 01/02/21 2335)    ED Course  I have reviewed the triage vital signs and the nursing notes.  Pertinent imaging results that were available during my care of the  patient were reviewed by me and considered in my medical decision making (see chart for details).    MDM Rules/Calculators/A&P                           Patient presents because she had increasing right leg pain.  She reports she has had pain around her knee since previous arthroscopic surgery.  It became worse today and that she began feeling anxious and that her blood pressure was elevated. Patient is already on Eliquis and reports daily med compliance. Patient is concerned about DVT of the leg.  Will perform ultrasound 12:31 AM I have personally reviewed the ultrasound imaging, and it is negative Patient feels improved.  Blood pressure is improving.  No signs of any acute vascular emergency to her leg.  There is no acute infectious orthopedic emergency. She will be discharged home Final Clinical Impression(s) / ED Diagnoses Final diagnoses:  Right leg pain    Rx / DC Orders ED Discharge Orders     None         Zadie Rhine, MD 01/03/21 [phone]

## 2021-01-03 NOTE — Discharge Instructions (Addendum)
You can followup with your orthopedic doctor if the pain in your leg continues

## 2022-03-03 IMAGING — US US EXTREM LOW VENOUS*R*
1 series · 12 of 12 positions shown · non-contrast
Comparison: None.

CLINICAL DATA: Right leg pain/swelling

EXAM:
RIGHT LOWER EXTREMITY VENOUS DOPPLER ULTRASOUND
TECHNIQUE: Gray-scale sonography with compression, as well as color and duplex
ultrasound, were performed to evaluate the deep venous system(s)
from the level of the common femoral vein through the popliteal and
proximal calf veins.

[Series 2: us extrem low venous*right* · 12 of 12 slices shown]
[im 1/12]
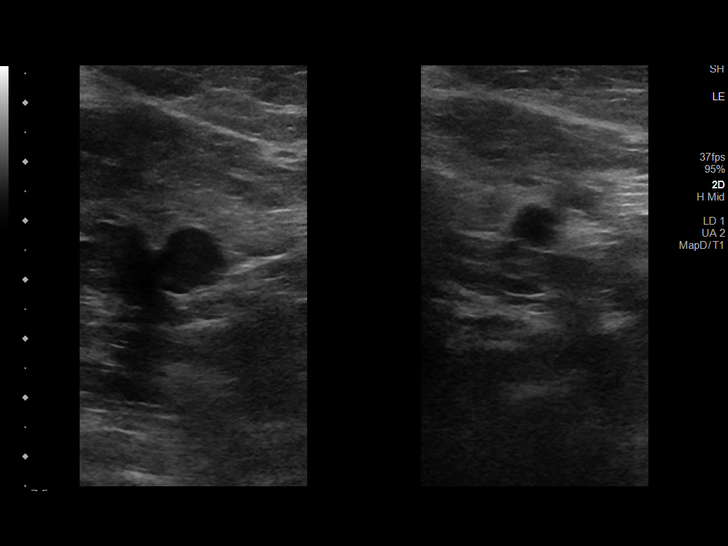
[im 2/12]
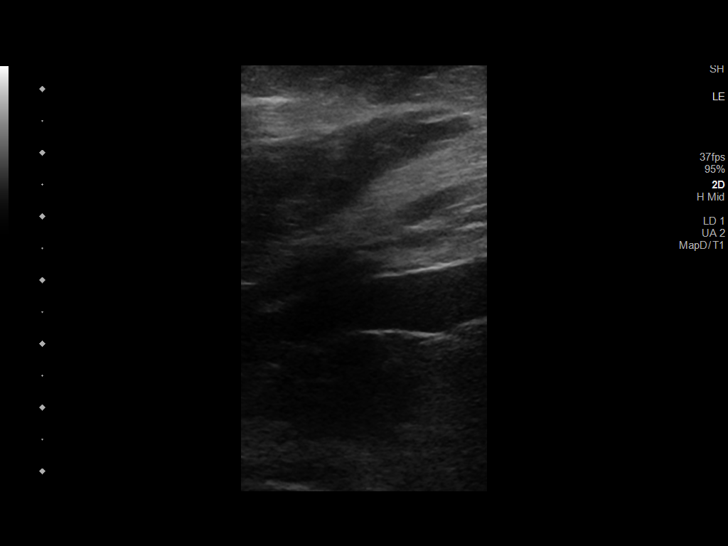
[im 3/12]
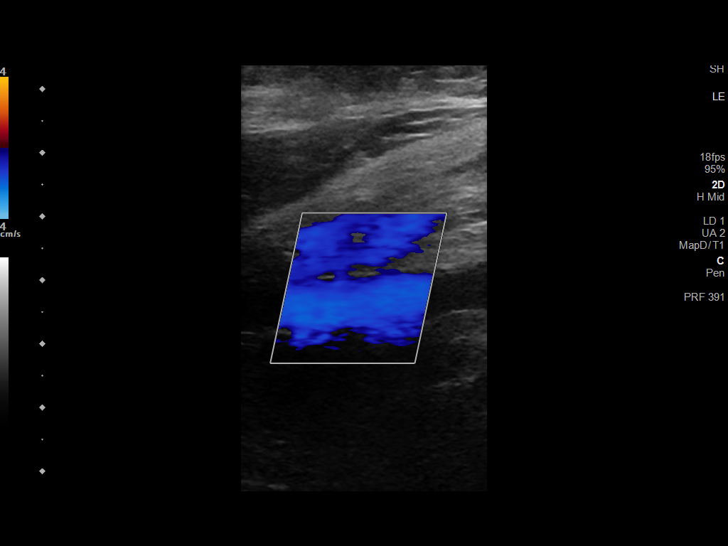
[im 4/12]
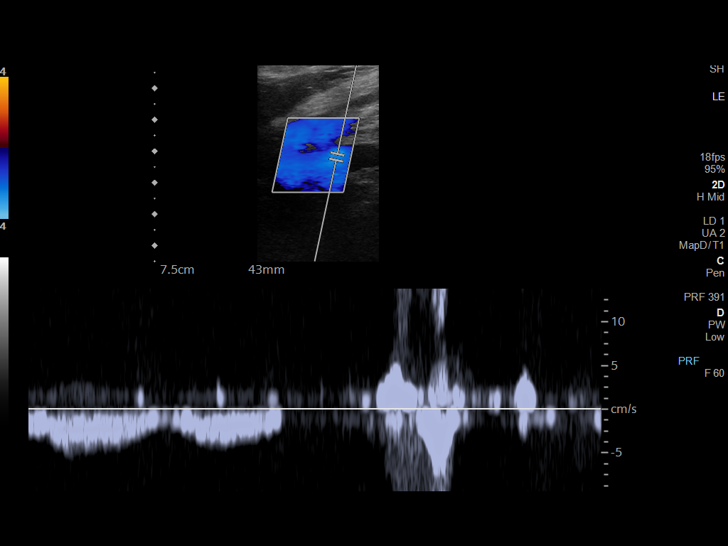
[im 5/12]
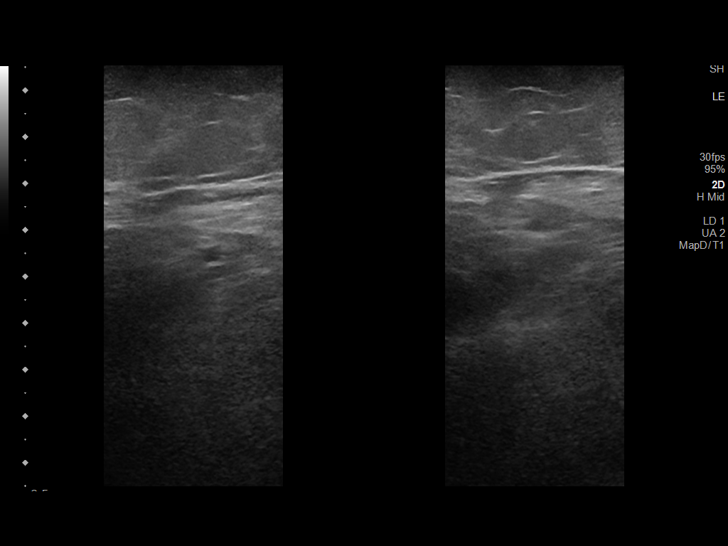
[im 6/12]
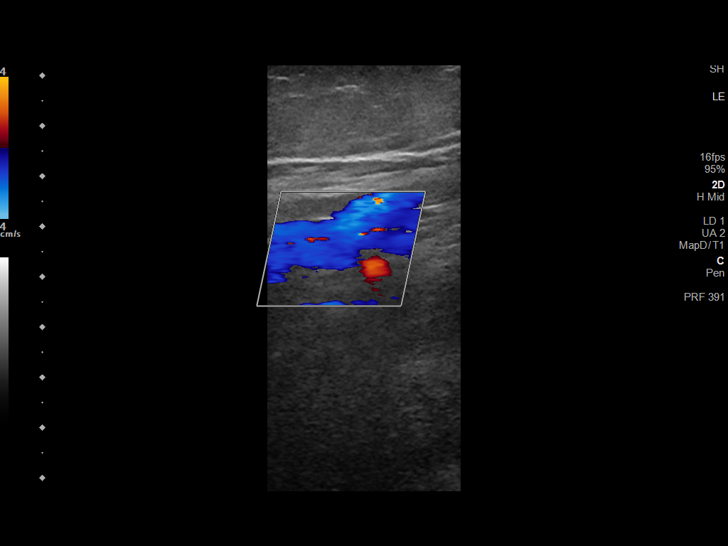
[im 7/12]
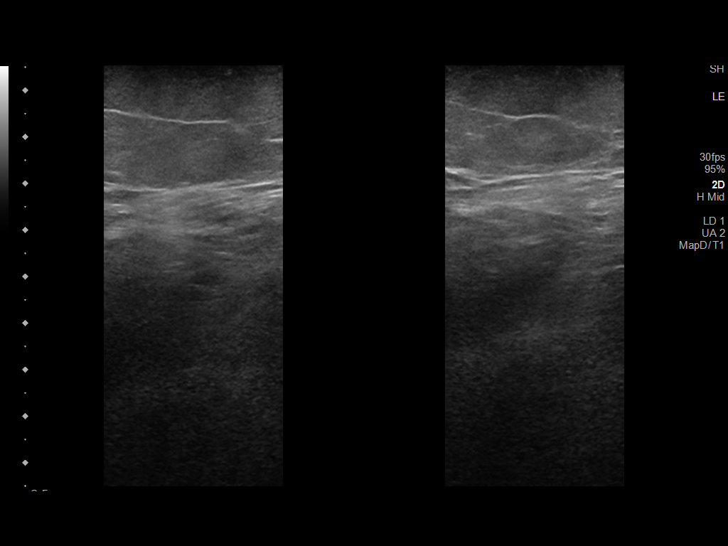
[im 8/12]
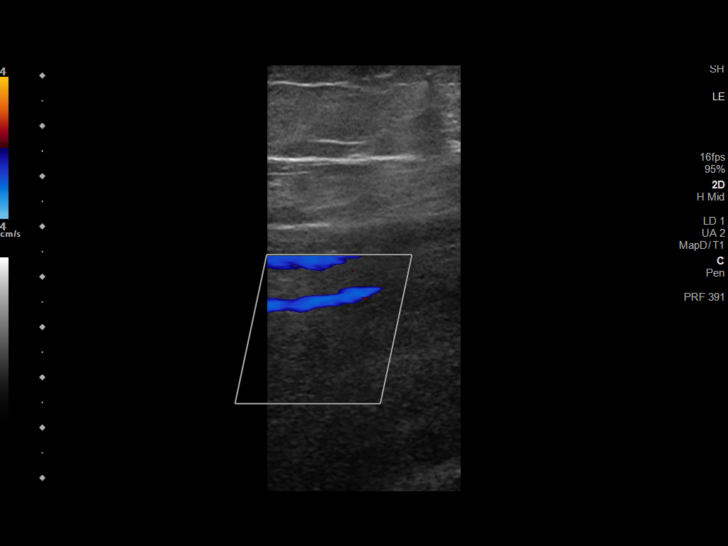
[im 9/12]
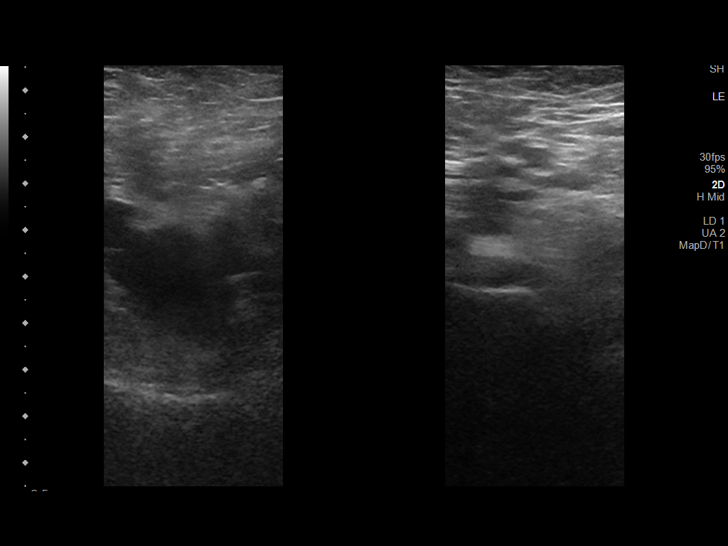
[im 10/12]
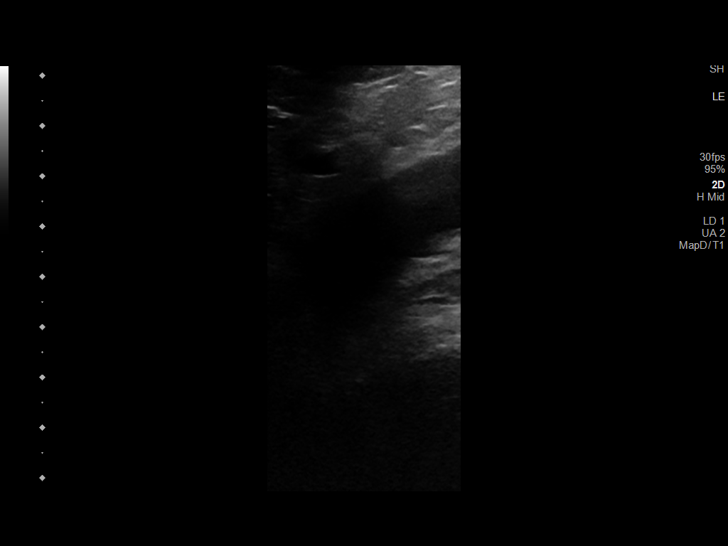
[im 11/12]
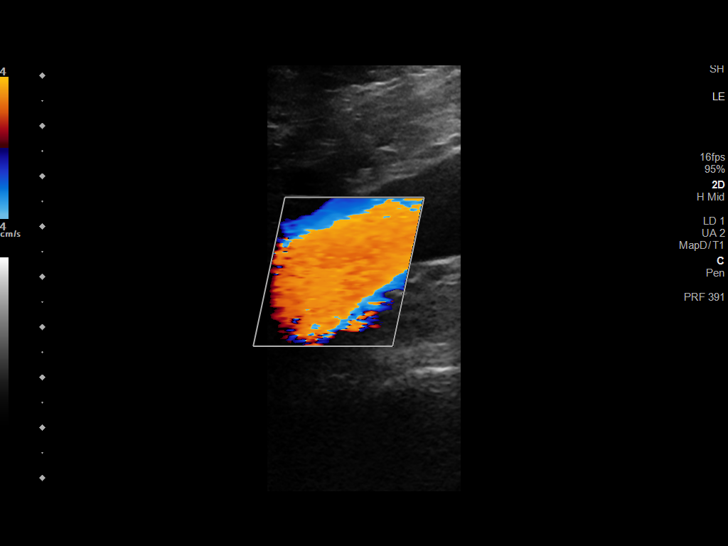
[im 12/12]
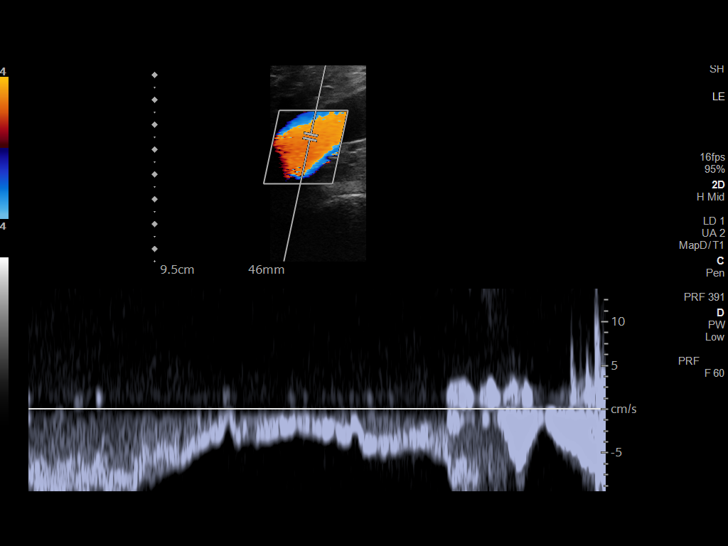

[12 of 12 positions shown; findings below may reference images not displayed]

FINDINGS: VENOUS

Normal compressibility of the common femoral, superficial femoral,
and popliteal veins, as well as the visualized calf veins.
Visualized portions of profunda femoral vein and great saphenous
vein unremarkable. No filling defects to suggest DVT on grayscale or
color Doppler imaging. Doppler waveforms show normal direction of
venous flow, normal respiratory plasticity and response to
augmentation.

Limited views of the contralateral common femoral vein are
unremarkable.

OTHER

None.

Limitations: none
IMPRESSION: Negative.

## 2023-01-28 ENCOUNTER — Emergency Department (HOSPITAL_BASED_OUTPATIENT_CLINIC_OR_DEPARTMENT_OTHER): Payer: PRIVATE HEALTH INSURANCE

## 2023-01-28 ENCOUNTER — Emergency Department (HOSPITAL_BASED_OUTPATIENT_CLINIC_OR_DEPARTMENT_OTHER)
Admission: EM | Admit: 2023-01-28 | Discharge: 2023-01-28 | Disposition: A | Discharge status: Home / Self Care | Payer: PRIVATE HEALTH INSURANCE

## 2023-01-28 DIAGNOSIS — N764 Abscess of vulva: Secondary | ICD-10-CM | POA: Diagnosis not present

## 2023-01-28 DIAGNOSIS — M25511 Pain in right shoulder: Secondary | ICD-10-CM

## 2023-01-28 DIAGNOSIS — Z7901 Long term (current) use of anticoagulants: Secondary | ICD-10-CM | POA: Insufficient documentation

## 2023-01-28 DIAGNOSIS — Z79899 Other long term (current) drug therapy: Secondary | ICD-10-CM | POA: Diagnosis not present

## 2023-01-28 MED ORDER — PREDNISONE 50 MG PO TABS: ORAL_TABLET | ORAL | 0 refills | Status: AC

## 2023-01-28 MED ORDER — DOXYCYCLINE HYCLATE 100 MG PO CAPS: 100.0000 mg | ORAL_CAPSULE | Freq: Two times a day (BID) | ORAL | 0 refills | Status: AC

## 2023-01-28 MED ORDER — TRAMADOL HCL 50 MG PO TABS: 50.0000 mg | ORAL_TABLET | Freq: Four times a day (QID) | ORAL | 0 refills | Status: DC | PRN

## 2023-01-28 NOTE — ED Triage Notes (Addendum)
Pt reports right/left shoulder pain after grandchild jumped and landed on top of her one weeks ago. Right shoulder worse than left  Pt only taking Tylenol currently on Elquis for Afib Also reports abscess right labia x 1 week. Increasing in size, but no drainage

## 2023-01-28 NOTE — Discharge Instructions (Addendum)
See your Physician for recheck.  Return if any problems.  Soak area of infection  20 minutes 4 times a day.

## 2023-02-01 NOTE — ED Provider Notes (Signed)
[place] EMERGENCY DEPARTMENT AT [facility] Provider Note   CSN: [id number] Arrival date & time: 01/28/23  1356     History  Chief Complaint  Patient presents with   Shoulder Pain   Abscess    Judith Aguirre is a 52 y.o. female.  Pt complains of pain in her right shoulder after her grandchild hit her in the right shoulder.  Patient also reports that she has a sore area on her left labia.  She is concerned that she has an abscess.  Patient denies any fever or chills.  Patient reports that she has chronic pain in both of her shoulders.  Patient has a past medical history of A-fib.  She is on Eliquis.  The history is provided by the patient. No language interpreter was used.  Shoulder Pain Pain details:    Quality:  Aching   Radiates to:  R shoulder   Severity:  Moderate   Progression:  Worsening Relieved by:  Nothing Worsened by:  Nothing Abscess      Home Medications Prior to Admission medications   Medication Sig Start Date End Date Taking? Authorizing Provider  apixaban (ELIQUIS) 5 MG TABS tablet Take 1 tablet (5 mg total) by mouth 2 (two) times daily. 04/10/20  Yes Orpah Cobb, MD  diltiazem (CARDIZEM CD) 240 MG 24 hr capsule Take 1 capsule (240 mg total) by mouth daily. 04/10/20  Yes Orpah Cobb, MD  doxycycline (VIBRAMYCIN) 100 MG capsule Take 1 capsule (100 mg total) by mouth 2 (two) times daily. 01/28/23  Yes Cheron Schaumann K, PA-C  losartan (COZAAR) 100 MG tablet Take 100 mg by mouth daily. 11/16/18  Yes [provider]  metoprolol tartrate (LOPRESSOR) 25 MG tablet Take 1 tablet (25 mg total) by mouth 2 (two) times daily. 04/09/20  Yes Orpah Cobb, MD  pantoprazole (PROTONIX) 40 MG tablet Take 40 mg by mouth at bedtime as needed (reflux).  12/10/18  Yes [provider]  potassium chloride 20 MEQ TBCR Take 10 mEq by mouth daily. 04/09/20  Yes Orpah Cobb, MD  predniSONE (DELTASONE) 50 MG tablet One tablet a day 01/28/23  Yes Elson Areas, PA-C  hydrochlorothiazide (HYDRODIURIL) 25 MG tablet Take 25 mg by mouth daily. 12/10/18   [provider]  temazepam (RESTORIL) 30 MG capsule Take 30 mg by mouth at bedtime as needed for sleep.  12/06/18   [provider]      Allergies    Lisinopril    Review of Systems   Review of Systems  All other systems reviewed and are negative.   Physical Exam Updated Vital Signs BP (!) 152/87   Pulse 99   Temp 98.6 F (37 C) (Oral)   Resp 17   SpO2 98%  Physical Exam Vitals and nursing note reviewed.  Constitutional:      Appearance: She is well-developed.  HENT:     Head: Normocephalic.     Right Ear: External ear normal.     Left Ear: External ear normal.     Nose: Nose normal.  Cardiovascular:     Rate and Rhythm: Normal rate.  Pulmonary:     Effort: Pulmonary effort is normal.  Abdominal:     General: There is no distension.  Genitourinary:    Comments: Left labia open draining area, tender to palpation Musculoskeletal:        General: Tenderness present. Normal range of motion.     Cervical back: Normal range of motion.  Comments: Right shoulder, pain with range of motion, neurovascular neurosensory intact  Skin:    General: Skin is warm.  Neurological:     General: No focal deficit present.     Mental Status: She is alert and oriented to person, place, and time.     ED Results / Procedures / Treatments   Labs (all labs ordered are listed, but only abnormal results are displayed) Labs Reviewed - No data to display  EKG None  Radiology No results found.  Procedures Procedures    Medications Ordered in ED Medications - No data to display  ED Course/ Medical Decision Making/ A&P                                 Medical Decision Making Complains of pain in her right shoulder after her grandchild struck her shoulder.  Patient also has a abscess to the left labia that has been draining  Amount and/or Complexity of Data  Reviewed Radiology: ordered and independent interpretation performed. Decision-making details documented in ED Course.    Details: Spray right shoulder shows no acute findings  Risk Prescription drug management. Risk Details: Patient is given a prescription for doxycycline, she is advised warm compresses 4 times a day to the area of swelling.  Patient offered tramadol for her shoulder.  Patient states she would rather have a prescription for prednisone.  Patient is given a prescription for prednisone she is advised to follow-up with her physician for recheck.           Final Clinical Impression(s) / ED Diagnoses Final diagnoses:  Acute pain of right shoulder  Abscess of left genital labia    Rx / DC Orders ED Discharge Orders          Ordered    doxycycline (VIBRAMYCIN) 100 MG capsule  2 times daily        01/28/23 1710    traMADol (ULTRAM) 50 MG tablet  Every 6 hours PRN,   Status:  Discontinued        01/28/23 1710    predniSONE (DELTASONE) 50 MG tablet        01/28/23 1717           An After Visit Summary was printed and given to the patient.    Elson Areas, PA-C 02/01/23 1546    Coral Spikes, DO 02/01/23 1549

## 2023-03-04 ENCOUNTER — Encounter: Payer: Self-pay | Admitting: Family Medicine

## 2023-03-14 ENCOUNTER — Emergency Department (HOSPITAL_BASED_OUTPATIENT_CLINIC_OR_DEPARTMENT_OTHER): Payer: PRIVATE HEALTH INSURANCE

## 2023-03-14 ENCOUNTER — Emergency Department (HOSPITAL_BASED_OUTPATIENT_CLINIC_OR_DEPARTMENT_OTHER)
Admission: EM | Admit: 2023-03-14 | Discharge: 2023-03-14 | Disposition: A | Discharge status: Home / Self Care | Payer: PRIVATE HEALTH INSURANCE | Attending: Emergency Medicine | Admitting: Emergency Medicine

## 2023-03-14 ENCOUNTER — Other Ambulatory Visit: Payer: Self-pay

## 2023-03-14 ENCOUNTER — Encounter (HOSPITAL_BASED_OUTPATIENT_CLINIC_OR_DEPARTMENT_OTHER): Payer: Self-pay

## 2023-03-14 DIAGNOSIS — Z7901 Long term (current) use of anticoagulants: Secondary | ICD-10-CM | POA: Diagnosis not present

## 2023-03-14 DIAGNOSIS — Z79899 Other long term (current) drug therapy: Secondary | ICD-10-CM | POA: Diagnosis not present

## 2023-03-14 DIAGNOSIS — R059 Cough, unspecified: Secondary | ICD-10-CM | POA: Diagnosis present

## 2023-03-14 DIAGNOSIS — B338 Other specified viral diseases: Secondary | ICD-10-CM

## 2023-03-14 DIAGNOSIS — I10 Essential (primary) hypertension: Secondary | ICD-10-CM | POA: Insufficient documentation

## 2023-03-14 DIAGNOSIS — Z20822 Contact with and (suspected) exposure to covid-19: Secondary | ICD-10-CM | POA: Diagnosis not present

## 2023-03-14 DIAGNOSIS — R0981 Nasal congestion: Secondary | ICD-10-CM | POA: Diagnosis not present

## 2023-03-14 LAB — RESP PANEL BY RT-PCR (RSV, FLU A&B, COVID)  RVPGX2
Influenza A by PCR: NEGATIVE
Influenza B by PCR: NEGATIVE
Resp Syncytial Virus by PCR: POSITIVE — AB
SARS Coronavirus 2 by RT PCR: NEGATIVE

## 2023-03-14 NOTE — ED Provider Notes (Signed)
[place] EMERGENCY DEPARTMENT AT [facility] Provider Note   CSN: [id number] Arrival date & time: 03/14/23  7829     History  Chief Complaint  Patient presents with   Cough    Judith Aguirre is a 53 y.o. female.   Cough 53 year old female history of atrial fibrillation on Eliquis, GERD, hypertension presenting for cough, congestion.  Husband at bedside was recently sick.  Occasionally feels somewhat short of breath.  No chest pain.  Subjective fever last night.  She has some mild headache as well.  Not sudden onset.  No nausea or vomiting.  She is tolerating p.o.  No abdominal pain.  She feels very congested in her nose, has not been blowing her nose.  Cough productive of some phlegm, no hemoptysis.    Home Medications Prior to Admission medications   Medication Sig Start Date End Date Taking? Authorizing Provider  apixaban (ELIQUIS) 5 MG TABS tablet Take 1 tablet (5 mg total) by mouth 2 (two) times daily. 04/10/20   Orpah Cobb, MD  diltiazem (CARDIZEM CD) 240 MG 24 hr capsule Take 1 capsule (240 mg total) by mouth daily. 04/10/20   Orpah Cobb, MD  doxycycline (VIBRAMYCIN) 100 MG capsule Take 1 capsule (100 mg total) by mouth 2 (two) times daily. 01/28/23   Elson Areas, PA-C  hydrochlorothiazide (HYDRODIURIL) 25 MG tablet Take 25 mg by mouth daily. 12/10/18   [provider]  losartan (COZAAR) 100 MG tablet Take 100 mg by mouth daily. 11/16/18   [provider]  metoprolol tartrate (LOPRESSOR) 25 MG tablet Take 1 tablet (25 mg total) by mouth 2 (two) times daily. 04/09/20   Orpah Cobb, MD  pantoprazole (PROTONIX) 40 MG tablet Take 40 mg by mouth at bedtime as needed (reflux).  12/10/18   [provider]  potassium chloride 20 MEQ TBCR Take 10 mEq by mouth daily. 04/09/20   Orpah Cobb, MD  predniSONE (DELTASONE) 50 MG tablet One tablet a day 01/28/23   Cheron Schaumann K, PA-C  temazepam (RESTORIL) 30 MG capsule Take 30 mg by mouth at  bedtime as needed for sleep.  12/06/18   [provider]      Allergies    Lisinopril    Review of Systems   Review of Systems  Respiratory:  Positive for cough.   Review of systems completed and notable as per HPI.  ROS otherwise negative.   Physical Exam Updated Vital Signs BP 129/82 (BP Location: Left Wrist)   Pulse 75   Temp 98.9 F (37.2 C) (Oral)   Resp 20   Ht 5\' 11"  (1.803 m)   Wt (!) 180.5 kg   SpO2 100%   BMI 55.51 kg/m  Physical Exam Vitals and nursing note reviewed.  Constitutional:      General: She is not in acute distress.    Appearance: She is well-developed.  HENT:     Head: Normocephalic and atraumatic.     Mouth/Throat:     Mouth: Mucous membranes are moist.     Pharynx: Oropharynx is clear.  Eyes:     Extraocular Movements: Extraocular movements intact.     Conjunctiva/sclera: Conjunctivae normal.     Pupils: Pupils are equal, round, and reactive to light.  Cardiovascular:     Rate and Rhythm: Normal rate and regular rhythm.     Pulses: Normal pulses.     Heart sounds: Normal heart sounds. No murmur heard. Pulmonary:     Effort: Pulmonary effort is  normal. No respiratory distress.     Breath sounds: Normal breath sounds.  Abdominal:     Palpations: Abdomen is soft.     Tenderness: There is no abdominal tenderness.  Musculoskeletal:        General: No swelling.     Cervical back: Neck supple.     Right lower leg: No edema.     Left lower leg: No edema.  Skin:    General: Skin is warm and dry.     Capillary Refill: Capillary refill takes less than 2 seconds.  Neurological:     Mental Status: She is alert.  Psychiatric:        Mood and Affect: Mood normal.     ED Results / Procedures / Treatments   Labs (all labs ordered are listed, but only abnormal results are displayed) Labs Reviewed  RESP PANEL BY RT-PCR (RSV, FLU A&B, COVID)  RVPGX2 - Abnormal; Notable for the following components:      Result Value   Resp Syncytial  Virus by PCR POSITIVE (*)    All other components within normal limits    EKG None  Radiology DG Chest 2 View Result Date: 03/14/2023 CLINICAL DATA:  Cough for 3 days.  Pain with coughing. EXAM: CHEST - 2 VIEW COMPARISON:  04/08/2020. FINDINGS: Normal heart, mediastinum and hila. Clear lungs.  No pleural effusion or pneumothorax. Skeletal structures are unremarkable. IMPRESSION: No active cardiopulmonary disease. Electronically Signed   By: Amie Portland M.D.   On: 03/14/2023 10:19    Procedures Procedures    Medications Ordered in ED Medications - No data to display  ED Course/ Medical Decision Making/ A&P                                 Medical Decision Making Amount and/or Complexity of Data Reviewed Radiology: ordered.   Medical Decision Making:   Judith Aguirre is a 53 y.o. female who presented to the ED today with cough, congestion provide signs reviewed.  Exam she is well-appearing.  Normal work of breathing and clear lungs.  She is not had any chest pain, occasionally feels some shortness of breath but has normal work of breathing.  She is RSV positive which is consistent with her symptoms of likely viral upper respiratory infection.  I reviewed her chest x-ray, I do not see signs of pneumonia, she is no fever or increased work of breathing or focality on exam I have low suspicion for bacterial pneumonia.  I recommend she follow close with her PCP, encouraged over-the-counter cold/flu medication, plenty of fluids.  Strict return precautions given.  She is comfortable with this plan.  Patient's presentation is most consistent with acute complicated illness / injury requiring diagnostic workup.           Final Clinical Impression(s) / ED Diagnoses Final diagnoses:  RSV (respiratory syncytial virus infection)    Rx / DC Orders ED Discharge Orders     None         Laurence Spates, MD 03/14/23 1045

## 2023-03-14 NOTE — ED Triage Notes (Signed)
C/o productive cough, congestion, slight shortness of breath since Thursday. Husband also sick.

## 2023-03-14 NOTE — Discharge Instructions (Signed)
You have RSV.  Your x-ray did not show any pneumonia.  I recommend you follow-up closely with your primary care doctor.  If you develop difficulty breathing, chest pain, high fever persistent vomiting, inability eat or drink or any other new concerning symptoms you should return to the ED.

## 2023-10-01 ENCOUNTER — Other Ambulatory Visit (HOSPITAL_BASED_OUTPATIENT_CLINIC_OR_DEPARTMENT_OTHER): Payer: Self-pay | Admitting: Physician Assistant

## 2023-10-01 ENCOUNTER — Ambulatory Visit (HOSPITAL_BASED_OUTPATIENT_CLINIC_OR_DEPARTMENT_OTHER): Payer: PRIVATE HEALTH INSURANCE

## 2023-10-01 ENCOUNTER — Encounter (HOSPITAL_BASED_OUTPATIENT_CLINIC_OR_DEPARTMENT_OTHER): Payer: Self-pay | Admitting: Physician Assistant

## 2023-10-01 ENCOUNTER — Telehealth (HOSPITAL_BASED_OUTPATIENT_CLINIC_OR_DEPARTMENT_OTHER): Payer: Self-pay

## 2023-10-01 ENCOUNTER — Ambulatory Visit (INDEPENDENT_AMBULATORY_CARE_PROVIDER_SITE_OTHER): Payer: PRIVATE HEALTH INSURANCE | Admitting: Physician Assistant

## 2023-10-01 DIAGNOSIS — G8929 Other chronic pain: Secondary | ICD-10-CM | POA: Diagnosis not present

## 2023-10-01 DIAGNOSIS — M25561 Pain in right knee: Secondary | ICD-10-CM | POA: Diagnosis not present

## 2023-10-01 DIAGNOSIS — M1711 Unilateral primary osteoarthritis, right knee: Secondary | ICD-10-CM

## 2023-10-01 DIAGNOSIS — M7541 Impingement syndrome of right shoulder: Secondary | ICD-10-CM | POA: Diagnosis not present

## 2023-10-01 MED ORDER — LIDOCAINE HCL 1 % IJ SOLN
4.0000 mL | INTRAMUSCULAR | Status: AC | PRN
  Administered 2023-10-01: 4 mL

## 2023-10-01 MED ORDER — LIDOCAINE HCL 1 % IJ SOLN
5.0000 mL | INTRAMUSCULAR | Status: AC | PRN
  Administered 2023-10-01: 5 mL

## 2023-10-01 MED ORDER — TRIAMCINOLONE ACETONIDE 40 MG/ML IJ SUSP
40.0000 mg | INTRAMUSCULAR | Status: AC | PRN
  Administered 2023-10-01: 40 mg via INTRA_ARTICULAR

## 2023-10-01 MED ORDER — METHYLPREDNISOLONE ACETATE 40 MG/ML IJ SUSP
40.0000 mg | INTRAMUSCULAR | Status: AC | PRN
  Administered 2023-10-01: 40 mg via INTRA_ARTICULAR

## 2023-10-01 MED ORDER — TRIAMCINOLONE ACETONIDE 40 MG/ML IJ SUSP
80.0000 mg | INTRAMUSCULAR | Status: AC | PRN
  Administered 2023-10-01: 80 mg via INTRA_ARTICULAR

## 2023-10-01 NOTE — Progress Notes (Signed)
   Office Visit Note   Patient: Judith Aguirre           Date of Birth: 23-Jul-1970           MRN: [id number] Visit Date: 10/01/2023              Requested by: Aletha Bene, MD [address] [place],  [place] [address] PCP: Aletha Bene, MD   Assessment & Plan: Visit Diagnoses: No diagnosis found.  Plan:   Follow-Up Instructions: No follow-ups on file.   Orders:  No orders of the defined types were placed in this encounter.  No orders of the defined types were placed in this encounter.     Procedures: No procedures performed   Clinical Data: No additional findings.   Subjective: Right knee pain right shoulder pain  HPI patient is a pleasant 53 year old woman who comes in today with a chief complaint of right shoulder and right knee pain.  She has had x-rays in the past and told she had bone spurs in her right shoulder.  She is status post knee scope with Dr. Jane in 2020.  She feels more pain now than the before surgery she has been losing weight.  She pain is more constant.  She took Robaxin last night woke up still in pain.  She has tried injections in the shoulder but has been up while she has had that.  Works from home  Review of Systems  All other systems reviewed and are negative.    Objective: Vital Signs: There were no vitals taken for this visit.  Physical Exam Constitutional:      Appearance: Normal appearance.  Pulmonary:     Effort: Pulmonary effort is normal.  Skin:    General: Skin is warm and dry.  Neurological:     General: No focal deficit present.     Mental Status: She is alert and oriented to person, place, and time.  Psychiatric:        Mood and Affect: Mood normal.        Behavior: Behavior normal.     Ortho Exam  Specialty Comments:  No specialty comments available.  Imaging: No results found.   PMFS History: Patient Active Problem List   Diagnosis Date Noted   Atrial fibrillation with RVR (HCC) 04/08/2020    Acute respiratory failure with hypoxemia (HCC) 01/30/2019   Multifocal pneumonia 01/30/2019   COVID-19 01/30/2019   Hypertension    Morbid obesity due to excess calories (HCC)    GERD (gastroesophageal reflux disease)    Past Medical History:  Diagnosis Date   A-fib (HCC)    GERD (gastroesophageal reflux disease)    Hypertension    Morbid obesity due to excess calories (HCC)     Family History  Problem Relation Age of Onset   Hypertension Mother     Past Surgical History:  Procedure Laterality Date   DILATION AND CURETTAGE OF UTERUS     KNEE SURGERY     Social History   Occupational History   Not on file  Tobacco Use   Smoking status: Never   Smokeless tobacco: Never  Vaping Use   Vaping status: Never Used  Substance and Sexual Activity   Alcohol use: Yes    Comment: occ   Drug use: Never   Sexual activity: Not on file

## 2023-10-01 NOTE — Progress Notes (Signed)
 Office Visit Note   Patient: Judith Aguirre           Date of Birth: August 04, 1970           MRN: [id number] Visit Date: 10/01/2023              Requested by: Aletha Bene, MD [address] [place],  [place] [address] PCP: Aletha Bene, MD   Assessment & Plan: Visit Diagnoses:  1. Chronic pain of right knee   2. Unilateral primary osteoarthritis, right knee   3. Impingement syndrome of right shoulder     Plan: Pleasant 53 year old woman who comes in for right shoulder pain and right knee pain.  No particular injuries.  She has been told in the past that she has bone spurs in her shoulder.  With regards to her knee she did have an arthroscopy in 2022 done elsewhere.  She tried some Robaxin but is still having some pain and she is in the process of losing weight.  Findings consistent with impingement syndrome with degenerative changes of the [facility] joint and the shoulder but good strength.  Offered her an injection today she would like to try this.  She does have fairly advanced tricompartmental arthritis in her right knee we talked about the history of this.  She would like to try a steroid injection today but would consider going forward with viscosupplementation.  She will contact me if she wishes to do this This patient is diagnosed with osteoarthritis of the knee(s).    Radiographs show evidence of joint space narrowing, osteophytes, subchondral sclerosis and/or subchondral cysts.  This patient has knee pain which interferes with functional and activities of daily living.    This patient has experienced inadequate response, adverse effects and/or intolerance with conservative treatments such as acetaminophen , NSAIDS, topical creams, physical therapy or regular exercise, knee bracing and/or weight loss.   This patient has experienced inadequate response or has a contraindication to intra articular steroid injections for at least 3 months.   This patient is not scheduled to have a  total knee replacement within 6 months of starting treatment with viscosupplementation.   Follow-Up Instructions: Return if symptoms worsen or fail to improve.   Orders:  Orders Placed This Encounter  Procedures   DG Shoulder Right   No orders of the defined types were placed in this encounter.     Procedures: Large Joint Inj: R knee on 10/01/2023 5:19 PM Indications: pain and diagnostic evaluation Details: 22 G 1.5 in needle, anteromedial approach  Arthrogram: No  Medications: 40 mg methylPREDNISolone  acetate 40 MG/ML; 4 mL lidocaine  1 %; 80 mg triamcinolone  acetonide 40 MG/ML Outcome: tolerated well, no immediate complications Procedure, treatment alternatives, risks and benefits explained, specific risks discussed. Consent was given by the patient.    Large Joint Inj: R subacromial bursa on 10/01/2023 5:20 PM Indications: diagnostic evaluation and pain Details: 1.5 in posterior approach  Arthrogram: No  Medications: 5 mL lidocaine  1 %; 40 mg methylPREDNISolone  acetate 40 MG/ML; 40 mg triamcinolone  acetonide 40 MG/ML Outcome: tolerated well, no immediate complications Procedure, treatment alternatives, risks and benefits explained, specific risks discussed. Consent was given by the patient. Immediately prior to procedure a time out was called to verify the correct patient, procedure, equipment, support staff and site/side marked as required. Patient was prepped and draped in the usual sterile fashion.       Clinical Data: No additional findings.   Subjective: Chief Complaint  Patient presents with  Right Shoulder - Pain   Right Knee - Pain    HPI pleasant 53 year old woman comes in today for evaluation of right shoulder and right knee pain.  She has had problems with these in the past and had injections which she think helped.  She has been told she had bone spurs in her shoulder.  She is currently lost 24 pounds.  She has had a knee scope in the past done  elsewhere  Review of Systems  All other systems reviewed and are negative.    Objective: Vital Signs: There were no vitals taken for this visit.  Physical Exam Constitutional:      Appearance: Normal appearance.  Pulmonary:     Effort: Pulmonary effort is normal.  Skin:    General: Skin is warm and dry.  Neurological:     General: No focal deficit present.     Mental Status: She is alert and oriented to person, place, and time.  Psychiatric:        Mood and Affect: Mood normal.        Behavior: Behavior normal.     Ortho Exam  Examination of her right knee she has no effusion no erythema she has good varus valgus anterior posterior stability.  She has grinding with range of motion. Examination of her right shoulder she has positive impingement findings good good strength with resisted abduction external/internal rotation positive empty can test no real pain with external rotation good grip test Specialty Comments:  No specialty comments available.  Imaging: No results found.   PMFS History: Patient Active Problem List   Diagnosis Date Noted   Unilateral primary osteoarthritis, right knee 10/01/2023   Impingement syndrome of right shoulder 10/01/2023   Atrial fibrillation with RVR (HCC) 04/08/2020   Acute respiratory failure with hypoxemia (HCC) 01/30/2019   Multifocal pneumonia 01/30/2019   COVID-19 01/30/2019   Hypertension    Morbid obesity due to excess calories (HCC)    GERD (gastroesophageal reflux disease)    Past Medical History:  Diagnosis Date   A-fib (HCC)    GERD (gastroesophageal reflux disease)    Hypertension    Morbid obesity due to excess calories (HCC)     Family History  Problem Relation Age of Onset   Hypertension Mother     Past Surgical History:  Procedure Laterality Date   DILATION AND CURETTAGE OF UTERUS     KNEE SURGERY     Social History   Occupational History   Not on file  Tobacco Use   Smoking status: Never    Smokeless tobacco: Never  Vaping Use   Vaping status: Never Used  Substance and Sexual Activity   Alcohol use: Yes    Comment: occ   Drug use: Never   Sexual activity: Not on file

## 2023-10-01 NOTE — Telephone Encounter (Signed)
 Hey I need auth for Visco for right knee for this patient.

## 2023-10-07 ENCOUNTER — Encounter (HOSPITAL_BASED_OUTPATIENT_CLINIC_OR_DEPARTMENT_OTHER): Payer: Self-pay

## 2023-10-07 NOTE — Telephone Encounter (Signed)
 VOB submitted for Monovisc, right knee

## 2023-11-03 ENCOUNTER — Other Ambulatory Visit: Payer: Self-pay

## 2023-11-03 DIAGNOSIS — M1711 Unilateral primary osteoarthritis, right knee: Secondary | ICD-10-CM

## 2023-11-26 ENCOUNTER — Telehealth: Payer: Self-pay

## 2023-12-07 ENCOUNTER — Other Ambulatory Visit: Payer: Self-pay | Admitting: Physician Assistant

## 2023-12-07 MED ORDER — METHYLPREDNISOLONE 4 MG PO TBPK: ORAL_TABLET | ORAL | 0 refills | Status: DC

## 2023-12-27 ENCOUNTER — Encounter: Payer: Self-pay | Admitting: Radiology

## 2023-12-31 ENCOUNTER — Ambulatory Visit: Payer: PRIVATE HEALTH INSURANCE | Admitting: Physician Assistant

## 2023-12-31 ENCOUNTER — Ambulatory Visit (INDEPENDENT_AMBULATORY_CARE_PROVIDER_SITE_OTHER): Payer: PRIVATE HEALTH INSURANCE | Admitting: Physician Assistant

## 2023-12-31 ENCOUNTER — Encounter: Payer: Self-pay | Admitting: Physician Assistant

## 2023-12-31 DIAGNOSIS — M7541 Impingement syndrome of right shoulder: Secondary | ICD-10-CM

## 2023-12-31 DIAGNOSIS — M1711 Unilateral primary osteoarthritis, right knee: Secondary | ICD-10-CM

## 2023-12-31 MED ORDER — HYALURONAN 88 MG/4ML IX SOSY
88.0000 mg | PREFILLED_SYRINGE | INTRA_ARTICULAR | Status: AC | PRN
  Administered 2023-12-31: 88 mg via INTRA_ARTICULAR

## 2023-12-31 MED ORDER — METHYLPREDNISOLONE ACETATE 40 MG/ML IJ SUSP
40.0000 mg | INTRAMUSCULAR | Status: AC | PRN
  Administered 2023-12-31: 40 mg via INTRA_ARTICULAR

## 2023-12-31 MED ORDER — LIDOCAINE HCL 1 % IJ SOLN
4.0000 mL | INTRAMUSCULAR | Status: AC | PRN
  Administered 2023-12-31: 4 mL

## 2023-12-31 NOTE — Progress Notes (Signed)
 Office Visit Note   Patient: Judith Aguirre           Date of Birth: Mar 17, 1970           MRN: [id number] Visit Date: 12/31/2023              Requested by: Aletha Bene, MD [address] [place],  [place] [address] PCP: Aletha Bene, MD  Chief Complaint  Patient presents with  . Right Shoulder - Pain  . Right Knee - Injections    monovisc      HPI:  Patient is a pleasant 53 year old woman who comes in for Monovisc injection into her right knee.  She has a history of osteoarthritis.  She also has a history of impingement syndrome in her right shoulder has had an injection in the past and done well is requesting another injection as well no recent injuries  Assessment & Plan: Visit Diagnoses:  1. Unilateral primary osteoarthritis, right knee   2. Impingement syndrome of right shoulder     Plan: Side effects of the Monovisc were discussed went forward with injection she may follow-up as needed also went forward with right shoulder injection May follow-up as needed  Follow-Up Instructions: No follow-ups on file.   Ortho Exam  Patient is alert, oriented, no adenopathy, well-dressed, normal affect, normal respiratory effort. Right knee no effusion no erythema compartments are soft and compressible Right shoulder some impingement findings strength is intact    Imaging: No results found. No images are attached to the encounter.  Labs: Lab Results  Component Value Date   HGBA1C 6.1 (H) 02/02/2019   CRP 3.6 (H) 02/03/2019   CRP 6.8 (H) 02/02/2019   CRP 12.2 (H) 02/01/2019     Lab Results  Component Value Date   ALBUMIN 3.2 (L) 02/03/2019   ALBUMIN 3.2 (L) 02/02/2019   ALBUMIN 3.4 (L) 02/01/2019    Lab Results  Component Value Date   MG 1.8 04/08/2020   MG 2.3 02/02/2019   MG 2.2 02/01/2019   No results found for: VD25OH  No results found for: PREALBUMIN    Latest Ref Rng & Units 04/09/2020    3:33 AM 04/08/2020    5:20 PM 02/03/2019    4:45  AM  CBC EXTENDED  WBC 4.0 - 10.5 K/uL 8.5  8.9  7.1   RBC 3.87 - 5.11 MIL/uL 4.59  4.49  4.56   Hemoglobin 12.0 - 15.0 g/dL 86.5  86.5  86.7   HCT 36.0 - 46.0 % 41.1  39.8  40.3   Platelets 150 - 400 K/uL 282  326  444   NEUT# 1.7 - 7.7 K/uL   3.9   Lymph# 0.7 - 4.0 K/uL   2.4      There is no height or weight on file to calculate BMI.  Orders:  No orders of the defined types were placed in this encounter.  No orders of the defined types were placed in this encounter.    Procedures: Large Joint Inj: R knee on 12/31/2023 2:33 PM Indications: pain and diagnostic evaluation Details: 22 G 1.5 in needle, anteromedial approach  Arthrogram: No  Medications: 88 mg Hyaluronan 88 MG/4ML Outcome: tolerated well, no immediate complications Procedure, treatment alternatives, risks and benefits explained, specific risks discussed. Consent was given by the patient. Immediately prior to procedure a time out was called to verify the correct patient, procedure, equipment, support staff and site/side marked as required. Patient was prepped and draped  in the usual sterile fashion.    Large Joint Inj: R subacromial bursa on 12/31/2023 2:33 PM Indications: diagnostic evaluation and pain Details: 25 G 1.5 in needle, posterior approach  Arthrogram: No  Medications: 4 mL lidocaine  1 %; 40 mg methylPREDNISolone  acetate 40 MG/ML Outcome: tolerated well, no immediate complications Procedure, treatment alternatives, risks and benefits explained, specific risks discussed. Consent was given by the patient.      Clinical Data: No additional findings.  ROS:  All other systems negative, except as noted in the HPI. Review of Systems  Objective: Vital Signs: There were no vitals taken for this visit.  Specialty Comments:  No specialty comments available.  PMFS History: Patient Active Problem List   Diagnosis Date Noted  . Unilateral primary osteoarthritis, right knee 10/01/2023  .  Impingement syndrome of right shoulder 10/01/2023  . Atrial fibrillation with RVR (HCC) 04/08/2020  . Acute respiratory failure with hypoxemia (HCC) 01/30/2019  . Multifocal pneumonia 01/30/2019  . COVID-19 01/30/2019  . Hypertension   . Morbid obesity due to excess calories (HCC)   . GERD (gastroesophageal reflux disease)    Past Medical History:  Diagnosis Date  . A-fib (HCC)   . GERD (gastroesophageal reflux disease)   . Hypertension   . Morbid obesity due to excess calories (HCC)     Family History  Problem Relation Age of Onset  . Hypertension Mother     Past Surgical History:  Procedure Laterality Date  . DILATION AND CURETTAGE OF UTERUS    . KNEE SURGERY     Social History   Occupational History  . Not on file  Tobacco Use  . Smoking status: Never  . Smokeless tobacco: Never  Vaping Use  . Vaping status: Never Used  Substance and Sexual Activity  . Alcohol use: Yes    Comment: occ  . Drug use: Never  . Sexual activity: Not on file

## 2024-01-28 ENCOUNTER — Other Ambulatory Visit: Payer: Self-pay | Admitting: Physician Assistant

## 2024-01-28 ENCOUNTER — Encounter: Payer: Self-pay | Admitting: Physician Assistant

## 2024-01-28 ENCOUNTER — Ambulatory Visit: Payer: PRIVATE HEALTH INSURANCE | Admitting: Physician Assistant

## 2024-01-28 DIAGNOSIS — M7541 Impingement syndrome of right shoulder: Secondary | ICD-10-CM | POA: Diagnosis not present

## 2024-01-28 DIAGNOSIS — M1711 Unilateral primary osteoarthritis, right knee: Secondary | ICD-10-CM

## 2024-01-28 MED ORDER — METHYLPREDNISOLONE 4 MG PO TBPK: ORAL_TABLET | ORAL | 0 refills | Status: AC

## 2024-01-28 MED ORDER — METHYLPREDNISOLONE ACETATE 40 MG/ML IJ SUSP
40.0000 mg | INTRAMUSCULAR | Status: AC | PRN
  Administered 2024-01-28: 40 mg via INTRA_ARTICULAR

## 2024-01-28 MED ORDER — METHYLPREDNISOLONE 4 MG PO TBPK: ORAL_TABLET | ORAL | 0 refills | Status: DC

## 2024-01-28 MED ORDER — LIDOCAINE HCL 1 % IJ SOLN
4.0000 mL | INTRAMUSCULAR | Status: AC | PRN
  Administered 2024-01-28: 4 mL

## 2024-01-28 NOTE — Progress Notes (Signed)
 Office Visit Note   Patient: Judith Aguirre           Date of Birth: 1970/07/24           MRN: [id number] Visit Date: 01/28/2024              Requested by: Aletha Bene, MD [address] [place],  [place] [address] PCP: Aletha Bene, MD  Chief Complaint  Patient presents with  . Right Shoulder - Pain  . Right Knee - Pain      HPI: Patient is a pleasant 53 year old woman who comes in today requesting a subacromial injection into her right shoulder as well as an injection into her right knee.  She has a history of osteoarthritis in her right knee and what presumed impingement syndrome in her right shoulder.  She has had 2 subacromial injections by myself in the past 1 was several months ago gave her great relief she had one a month ago that gave her much less relief.  She has had no new injury.  Assessment & Plan: Visit Diagnoses:  1. Impingement syndrome of right shoulder   2. Unilateral primary osteoarthritis, right knee     Plan: I explained to her today that I cannot do an subacromial injection as she recently had 1.  She complains more of biceps proximal pain today.  I would like to refer her to Dr. Burnetta and have her make an appointment with him for her next shoulder injection he could also do the knee if she was due.  She may follow-up with me as needed.  I also encouraged her to try physical therapy for her shoulder as she cannot take anti-inflammatories because of her anticoagulant.  She is willing to do this at least get a home exercise program.  Also could call her in a Medrol  Dosepak for now she would like to do that as well  Follow-Up Instructions: No follow-ups on file.   Ortho Exam  Patient is alert, oriented, no adenopathy, well-dressed, normal affect, normal respiratory effort. Examination of her right shoulder she does have pain with range of motion.  Neurologically is intact no neck pain.  She has positive empty can test positive speeds test.  Right knee  no effusion no erythema compartments are soft and compressible she is neurovascular intact    Imaging: No results found. No images are attached to the encounter.  Labs: Lab Results  Component Value Date   HGBA1C 6.1 (H) 02/02/2019   CRP 3.6 (H) 02/03/2019   CRP 6.8 (H) 02/02/2019   CRP 12.2 (H) 02/01/2019     Lab Results  Component Value Date   ALBUMIN 3.2 (L) 02/03/2019   ALBUMIN 3.2 (L) 02/02/2019   ALBUMIN 3.4 (L) 02/01/2019    Lab Results  Component Value Date   MG 1.8 04/08/2020   MG 2.3 02/02/2019   MG 2.2 02/01/2019   No results found for: VD25OH  No results found for: PREALBUMIN    Latest Ref Rng & Units 04/09/2020    3:33 AM 04/08/2020    5:20 PM 02/03/2019    4:45 AM  CBC EXTENDED  WBC 4.0 - 10.5 K/uL 8.5  8.9  7.1   RBC 3.87 - 5.11 MIL/uL 4.59  4.49  4.56   Hemoglobin 12.0 - 15.0 g/dL 86.5  86.5  86.7   HCT 36.0 - 46.0 % 41.1  39.8  40.3   Platelets 150 - 400 K/uL 282  326  444  NEUT# 1.7 - 7.7 K/uL   3.9   Lymph# 0.7 - 4.0 K/uL   2.4      There is no height or weight on file to calculate BMI.  Orders:  Orders Placed This Encounter  Procedures  . Ambulatory referral to Physical Therapy   Meds ordered this encounter  Medications  . methylPREDNISolone  (MEDROL  DOSEPAK) 4 MG TBPK tablet    Sig: Take as directed with food    Dispense:  21 tablet    Refill:  0  . DISCONTD: methylPREDNISolone  (MEDROL  DOSEPAK) 4 MG TBPK tablet    Sig: Take as directed with food    Dispense:  21 tablet    Refill:  0     Procedures: Large Joint Inj: R knee on 01/28/2024 1:39 PM Indications: pain and diagnostic evaluation Details: 25 G 1.5 in needle, anteromedial approach  Arthrogram: No  Medications: 40 mg methylPREDNISolone  acetate 40 MG/ML; 4 mL lidocaine  1 % Outcome: tolerated well, no immediate complications Procedure, treatment alternatives, risks and benefits explained, specific risks discussed. Consent was given by the patient.     Clinical  Data: No additional findings.  ROS:  All other systems negative, except as noted in the HPI. Review of Systems  Objective: Vital Signs: There were no vitals taken for this visit.  Specialty Comments:  No specialty comments available.  PMFS History: Patient Active Problem List   Diagnosis Date Noted  . Unilateral primary osteoarthritis, right knee 10/01/2023  . Impingement syndrome of right shoulder 10/01/2023  . Atrial fibrillation with RVR (HCC) 04/08/2020  . Acute respiratory failure with hypoxemia (HCC) 01/30/2019  . Multifocal pneumonia 01/30/2019  . COVID-19 01/30/2019  . Hypertension   . Morbid obesity due to excess calories (HCC)   . GERD (gastroesophageal reflux disease)    Past Medical History:  Diagnosis Date  . A-fib (HCC)   . GERD (gastroesophageal reflux disease)   . Hypertension   . Morbid obesity due to excess calories (HCC)     Family History  Problem Relation Age of Onset  . Hypertension Mother     Past Surgical History:  Procedure Laterality Date  . DILATION AND CURETTAGE OF UTERUS    . KNEE SURGERY     Social History   Occupational History  . Not on file  Tobacco Use  . Smoking status: Never  . Smokeless tobacco: Never  Vaping Use  . Vaping status: Never Used  Substance and Sexual Activity  . Alcohol use: Yes    Comment: occ  . Drug use: Never  . Sexual activity: Not on file

## 2024-03-30 ENCOUNTER — Ambulatory Visit: Payer: PRIVATE HEALTH INSURANCE | Admitting: Sports Medicine

## 2024-03-30 ENCOUNTER — Encounter: Payer: Self-pay | Admitting: Sports Medicine

## 2024-03-30 ENCOUNTER — Other Ambulatory Visit: Payer: Self-pay

## 2024-03-30 DIAGNOSIS — M19011 Primary osteoarthritis, right shoulder: Secondary | ICD-10-CM

## 2024-03-30 DIAGNOSIS — M7541 Impingement syndrome of right shoulder: Secondary | ICD-10-CM

## 2024-03-30 MED ORDER — METHYLPREDNISOLONE ACETATE 40 MG/ML IJ SUSP
40.0000 mg | INTRAMUSCULAR | Status: AC | PRN
  Administered 2024-03-30: 40 mg via INTRA_ARTICULAR

## 2024-03-30 MED ORDER — BUPIVACAINE HCL 0.25 % IJ SOLN
2.0000 mL | INTRAMUSCULAR | Status: AC | PRN
  Administered 2024-03-30: 2 mL via INTRA_ARTICULAR

## 2024-03-30 MED ORDER — LIDOCAINE HCL 1 % IJ SOLN
2.0000 mL | INTRAMUSCULAR | Status: AC | PRN
  Administered 2024-03-30: 2 mL

## 2024-03-30 NOTE — Progress Notes (Signed)
 "  Judith Aguirre - 54 y.o. female MRN [id number]  Date of birth: 11/30/1970  Office Visit Note: Visit Date: 03/30/2024 PCP: Aletha Bene, MD Referred by: Aletha Bene, MD  Subjective: Chief Complaint  Patient presents with   Right Shoulder - Pain   HPI: Judith Aguirre is a pleasant 54 y.o. female who presents today for acute on chronic right shoulder pain.  Lab Results  Component Value Date   HGBA1C 6.1 (H) 02/02/2019   Discussed the use of AI scribe software for clinical note transcription with the patient, who gave verbal consent to proceed.  History of Present Illness Judith Aguirre is a 54 year old female with chronic right shoulder pain and osteoarthritis who presents for evaluation of persistent right shoulder symptoms despite prior injections.  She has chronic right shoulder pain, currently mainly anterior with nocturnal radiation posteriorly. Pain worsens with reaching, certain positions, and prolonged typing. She notes intermittent posterior shoulder pinching with movement but no significant weakness.  An initial corticosteroid injection last year gave several months of relief, while a repeat injection in November 2025 relieved symptoms for about one month. She uses topical pain patches at night and as needed, and takes Tylenol  regularly. She cannot use NSAIDs due to anticoagulation for atrial fibrillation.  She has not had prior shoulder surgery. She receives periodic injections for knee pain, most recently about two months ago.   She has a notable past medical history of atrial fibrillation with RVR.  She is managed on Eliquis  5 mg twice daily.  Pertinent ROS were reviewed with the patient and found to be negative unless otherwise specified above in HPI.   Assessment & Plan: Visit Diagnoses:  1. Impingement syndrome of right shoulder   2. Primary osteoarthritis, right shoulder    Assessment & Plan Right shoulder impingement syndrome Chronic impingement with  recurrent pain, partially responsive to prior subacromial injections. Current management remains conservative due to comorbidities and medication restrictions.  - Performed ultrasound-guided corticosteroid injection into the right glenohumeral shoulder joint, targeting a different location than previous injections. - Advised her to monitor response and compare duration of relief to prior injections. - Instructed to use acetaminophen  and topical lidocaine  patches as needed for analgesia, avoiding NSAIDs due to anticoagulation with apixaban . - Recommended ice or heat application for 15-20 minutes as needed for post-injection soreness. - Advised to avoid heavy lifting with the right arm for 48 hours, but to continue light activities of daily living. - Instructed to follow up with her primary orthopaedic provider and report duration and effectiveness of this injection at her next visit. - Discussed that if this injection provides longer-lasting relief, future injections should target this location (GHJ), with a minimum interval of 90 days between injections. - Additional considerations could be to formalized physical therapy versus HEP  Right shoulder osteoarthritis Mild to mild-moderate glenohumeral osteoarthritis contributing to chronic pain with acromial spurring. Management is conservative with injections and analgesics. - Continued conservative management with intra-articular corticosteroid injection as above. - Advised to avoid NSAIDs due to anticoagulation therapy. - Encouraged ongoing use of acetaminophen  and topical lidocaine  patches as needed. - Recommended activity modification to avoid exacerbating pain. - Additional considerations could be to formalized physical therapy versus HEP   Follow-up: Return for F/u with Mary-Anne Persons as needed.   Meds & Orders: No orders of the defined types were placed in this encounter.   Orders Placed This Encounter  Procedures   Large Joint Inj    US   Guided Needle Placement - No Linked Charges     Procedures: Large Joint Inj: R glenohumeral on 03/30/2024 2:14 PM Indications: pain Details: 22 G 3.5 in needle, ultrasound-guided posterior approach Medications: 2 mL lidocaine  1 %; 2 mL bupivacaine  0.25 %; 40 mg methylPREDNISolone  acetate 40 MG/ML Outcome: tolerated well, no immediate complications  US -guided glenohumeral joint injection, right shoulder After discussion on risks/benefits/indications, informed verbal consent was obtained. A timeout was then performed. The patient was positioned lying lateral recumbent on examination table. The patient's shoulder was prepped with betadine and multiple alcohol swabs and utilizing ultrasound guidance, the patient's glenohumeral joint was identified on ultrasound. Using ultrasound guidance a 22-gauge, 3.5 inch needle with a mixture of 2:2:1.5 cc's lidocaine :bupivicaine:depomedrol was directed from a lateral to medial direction via in-plane technique into the glenohumeral joint with visualization of appropriate spread of injectate into the joint. Patient tolerated the procedure well without immediate complications.      Procedure, treatment alternatives, risks and benefits explained, specific risks discussed. Consent was given by the patient. Immediately prior to procedure a time out was called to verify the correct patient, procedure, equipment, support staff and site/side marked as required. Patient was prepped and draped in the usual sterile fashion.          Clinical History: No specialty comments available.  She reports that she has never smoked. She has never used smokeless tobacco. No results for input(s): HGBA1C, LABURIC in the last 8760 hours.  Objective:    Physical Exam  Gen: Well-appearing, in no acute distress; non-toxic CV: Well-perfused. Warm.  Resp: Breathing unlabored on room air; no wheezing. Psych: Fluid speech in conversation; appropriate affect; normal thought  process  *[facility]/Ortho Exam: Physical Exam  *Right shoulder: No significant redness swelling or effusion.  There is tenderness within the anterior shoulder joint recess.  There is 3-5 degrees less of forward flexion although otherwise has full and equivocal range of motion compared to the left shoulder.  There is mild pain at the 90/90 position with ER.  Negative drop arm test.  Positive Hawkins impingement testing.  There is relatively good preserved strength with rotator cuff testing.   MUSCULOSKELETAL: Shoulder range of motion normal. Mild pain on movement. Shoulder strength normal.  Imaging:  *Three-view right shoulder x-ray from 10/01/2023 was independently reviewed and interpreted by myself today.  X-rays demonstrate at least mild arthritic changes within the glenohumeral joint and AC joint.  There is inferior undersurface spurring off of the acromion but no significant narrowing of the subacromial outlet.  No acute fracture noted.  Past Medical/Family/Surgical/Social History: Medications & Allergies reviewed per EMR, new medications updated. Patient Active Problem List   Diagnosis Date Noted   Unilateral primary osteoarthritis, right knee 10/01/2023   Impingement syndrome of right shoulder 10/01/2023   Atrial fibrillation with RVR (HCC) 04/08/2020   Acute respiratory failure with hypoxemia (HCC) 01/30/2019   Multifocal pneumonia 01/30/2019   COVID-19 01/30/2019   Hypertension    Morbid obesity due to excess calories (HCC)    GERD (gastroesophageal reflux disease)    Past Medical History:  Diagnosis Date   A-fib (HCC)    GERD (gastroesophageal reflux disease)    Hypertension    Morbid obesity due to excess calories (HCC)    Family History  Problem Relation Age of Onset   Hypertension Mother    Past Surgical History:  Procedure Laterality Date   DILATION AND CURETTAGE OF UTERUS     KNEE SURGERY     Social History  Occupational History   Not on file  Tobacco Use    Smoking status: Never   Smokeless tobacco: Never  Vaping Use   Vaping status: Never Used  Substance and Sexual Activity   Alcohol use: Yes    Comment: occ   Drug use: Never   Sexual activity: Not on file   "
